# Patient Record
Sex: Female | Born: 1985 | Race: Black or African American | Hispanic: No | Marital: Married | Smoking: Never smoker
Health system: Southern US, Community
[De-identification: ages and names within clinical notes are randomized; demographics above are authoritative.]

## PROBLEM LIST (undated history)

## (undated) DIAGNOSIS — J45909 Unspecified asthma, uncomplicated: Secondary | ICD-10-CM

---

## 2014-03-01 DIAGNOSIS — O4290 Premature rupture of membranes, unspecified as to length of time between rupture and onset of labor, unspecified weeks of gestation: Secondary | ICD-10-CM

## 2014-03-01 DIAGNOSIS — O321XX0 Maternal care for breech presentation, not applicable or unspecified: Secondary | ICD-10-CM

## 2019-05-16 DIAGNOSIS — B349 Viral infection, unspecified: Secondary | ICD-10-CM | POA: Diagnosis not present

## 2019-05-16 DIAGNOSIS — J4541 Moderate persistent asthma with (acute) exacerbation: Secondary | ICD-10-CM | POA: Diagnosis not present

## 2019-07-05 DIAGNOSIS — Z1151 Encounter for screening for human papillomavirus (HPV): Secondary | ICD-10-CM | POA: Diagnosis not present

## 2019-07-05 DIAGNOSIS — Z6831 Body mass index (BMI) 31.0-31.9, adult: Secondary | ICD-10-CM | POA: Diagnosis not present

## 2019-07-05 DIAGNOSIS — Z01419 Encounter for gynecological examination (general) (routine) without abnormal findings: Secondary | ICD-10-CM | POA: Diagnosis not present

## 2019-07-14 DIAGNOSIS — J189 Pneumonia, unspecified organism: Secondary | ICD-10-CM | POA: Diagnosis not present

## 2019-07-14 DIAGNOSIS — J4521 Mild intermittent asthma with (acute) exacerbation: Secondary | ICD-10-CM | POA: Diagnosis not present

## 2019-08-01 DIAGNOSIS — E782 Mixed hyperlipidemia: Secondary | ICD-10-CM | POA: Diagnosis not present

## 2019-08-01 DIAGNOSIS — E559 Vitamin D deficiency, unspecified: Secondary | ICD-10-CM | POA: Diagnosis not present

## 2019-08-01 DIAGNOSIS — Z0000 Encounter for general adult medical examination without abnormal findings: Secondary | ICD-10-CM | POA: Diagnosis not present

## 2019-08-01 DIAGNOSIS — J45909 Unspecified asthma, uncomplicated: Secondary | ICD-10-CM | POA: Diagnosis not present

## 2019-09-27 DIAGNOSIS — E876 Hypokalemia: Secondary | ICD-10-CM | POA: Diagnosis not present

## 2019-09-27 DIAGNOSIS — Z8709 Personal history of other diseases of the respiratory system: Secondary | ICD-10-CM | POA: Diagnosis not present

## 2019-09-27 DIAGNOSIS — J4521 Mild intermittent asthma with (acute) exacerbation: Secondary | ICD-10-CM | POA: Diagnosis not present

## 2019-09-27 DIAGNOSIS — R0602 Shortness of breath: Secondary | ICD-10-CM | POA: Diagnosis not present

## 2019-09-28 DIAGNOSIS — Z8709 Personal history of other diseases of the respiratory system: Secondary | ICD-10-CM | POA: Diagnosis not present

## 2019-09-28 DIAGNOSIS — J45901 Unspecified asthma with (acute) exacerbation: Secondary | ICD-10-CM | POA: Diagnosis not present

## 2019-09-28 DIAGNOSIS — R0602 Shortness of breath: Secondary | ICD-10-CM | POA: Diagnosis not present

## 2019-09-28 DIAGNOSIS — J4521 Mild intermittent asthma with (acute) exacerbation: Secondary | ICD-10-CM | POA: Diagnosis not present

## 2019-09-28 DIAGNOSIS — E876 Hypokalemia: Secondary | ICD-10-CM | POA: Diagnosis not present

## 2019-09-28 DIAGNOSIS — R079 Chest pain, unspecified: Secondary | ICD-10-CM | POA: Diagnosis not present

## 2019-09-28 DIAGNOSIS — R9431 Abnormal electrocardiogram [ECG] [EKG]: Secondary | ICD-10-CM | POA: Diagnosis not present

## 2019-09-28 DIAGNOSIS — R0603 Acute respiratory distress: Secondary | ICD-10-CM | POA: Diagnosis not present

## 2019-09-28 DIAGNOSIS — R7301 Impaired fasting glucose: Secondary | ICD-10-CM | POA: Diagnosis not present

## 2019-09-29 DIAGNOSIS — J45901 Unspecified asthma with (acute) exacerbation: Secondary | ICD-10-CM | POA: Diagnosis not present

## 2019-09-29 DIAGNOSIS — R079 Chest pain, unspecified: Secondary | ICD-10-CM | POA: Diagnosis not present

## 2019-09-29 DIAGNOSIS — J9811 Atelectasis: Secondary | ICD-10-CM | POA: Diagnosis not present

## 2019-09-29 DIAGNOSIS — E876 Hypokalemia: Secondary | ICD-10-CM | POA: Diagnosis not present

## 2019-09-29 DIAGNOSIS — R0603 Acute respiratory distress: Secondary | ICD-10-CM | POA: Diagnosis not present

## 2019-09-30 DIAGNOSIS — E876 Hypokalemia: Secondary | ICD-10-CM | POA: Diagnosis not present

## 2019-09-30 DIAGNOSIS — J45901 Unspecified asthma with (acute) exacerbation: Secondary | ICD-10-CM | POA: Diagnosis not present

## 2019-09-30 DIAGNOSIS — R7301 Impaired fasting glucose: Secondary | ICD-10-CM | POA: Diagnosis not present

## 2019-10-04 DIAGNOSIS — R7309 Other abnormal glucose: Secondary | ICD-10-CM | POA: Diagnosis not present

## 2019-10-04 DIAGNOSIS — R0602 Shortness of breath: Secondary | ICD-10-CM | POA: Diagnosis not present

## 2019-10-10 DIAGNOSIS — R062 Wheezing: Secondary | ICD-10-CM | POA: Diagnosis not present

## 2019-10-10 DIAGNOSIS — Z6831 Body mass index (BMI) 31.0-31.9, adult: Secondary | ICD-10-CM | POA: Diagnosis not present

## 2019-10-10 DIAGNOSIS — J4530 Mild persistent asthma, uncomplicated: Secondary | ICD-10-CM | POA: Diagnosis not present

## 2019-10-10 DIAGNOSIS — J45909 Unspecified asthma, uncomplicated: Secondary | ICD-10-CM | POA: Diagnosis not present

## 2019-12-25 ENCOUNTER — Ambulatory Visit
Admission: EM | Admit: 2019-12-25 | Discharge: 2019-12-25 | Disposition: A | Discharge status: Home / Self Care | Payer: BC Managed Care – PPO

## 2019-12-25 ENCOUNTER — Ambulatory Visit (INDEPENDENT_AMBULATORY_CARE_PROVIDER_SITE_OTHER): Payer: BC Managed Care – PPO

## 2019-12-25 DIAGNOSIS — R0989 Other specified symptoms and signs involving the circulatory and respiratory systems: Secondary | ICD-10-CM

## 2019-12-25 DIAGNOSIS — R509 Fever, unspecified: Secondary | ICD-10-CM | POA: Diagnosis not present

## 2019-12-25 DIAGNOSIS — R05 Cough: Secondary | ICD-10-CM

## 2019-12-25 DIAGNOSIS — R059 Cough, unspecified: Secondary | ICD-10-CM

## 2019-12-25 DIAGNOSIS — Z1152 Encounter for screening for COVID-19: Secondary | ICD-10-CM

## 2019-12-25 HISTORY — DX: Unspecified asthma, uncomplicated: J45.909

## 2019-12-25 MED ORDER — BENZONATATE 200 MG PO CAPS: 200.0000 mg | ORAL_CAPSULE | Freq: Three times a day (TID) | ORAL | 0 refills | Status: DC

## 2019-12-25 MED ORDER — ACETAMINOPHEN 325 MG PO TABS
650.0000 mg | ORAL_TABLET | Freq: Once | ORAL | Status: AC
  Administered 2019-12-25: 650 mg via ORAL

## 2019-12-25 MED ORDER — AZELASTINE HCL 0.1 % NA SOLN: 2.0000 | Freq: Two times a day (BID) | NASAL | 0 refills | Status: DC

## 2019-12-25 NOTE — ED Provider Notes (Signed)
[facility]    CSN: [id number] Arrival date & time: 12/25/19  1216      History   Chief Complaint Chief Complaint  Patient presents with  . Nasal Congestion  . Cough    HPI Lauren Garner is a 34 y.o. female.   34 year old female comes in for 2 day history of productive cough, facial pressure, nasal congestion, right ear pain. States prior to this, has baseline cough due to asthma, and had 1 month of nasal congestion. Denies fever, chills, body aches. Denies abdominal pain, nausea, vomiting, diarrhea. Denies shortness of breath, loss of taste/smell. Denies urinary changes, tick bites. Has not needed to use albuterol.      Past Medical History:  Diagnosis Date  . Asthma     There are no problems to display for this patient.   Past Surgical History:  Procedure Laterality Date  . CESAREAN SECTION      OB History   No obstetric history on file.      Home Medications    Prior to Admission medications   Medication Sig Start Date End Date Taking? Authorizing Provider  albuterol (ACCUNEB) 0.63 MG/3ML nebulizer solution Take 1 ampule by nebulization every 6 (six) hours as needed for wheezing.   Yes [provider]  albuterol (VENTOLIN HFA) 108 (90 Base) MCG/ACT inhaler Inhale into the lungs every 6 (six) hours as needed for wheezing or shortness of breath.   Yes [provider]  budesonide-formoterol (SYMBICORT) 160-4.5 MCG/ACT inhaler Inhale 2 puffs into the lungs 2 (two) times daily.   Yes [provider]  loratadine (CLARITIN) 10 MG tablet Take 10 mg by mouth daily.   Yes [provider]  montelukast (SINGULAIR) 10 MG tablet Take 10 mg by mouth at bedtime.   Yes [provider]  azelastine (ASTELIN) 0.1 % nasal spray Place 2 sprays into both nostrils 2 (two) times daily. 12/25/19   Lauren Garner, Lauren Garner V, PA-C  benzonatate (TESSALON) 200 MG capsule Take 1 capsule (200 mg total) by mouth every 8 (eight) hours. 12/25/19    Ok Edwards, PA-C    Family History Family History  Problem Relation Age of Onset  . Cancer Mother   . Hypertension Mother   . Hyperlipidemia Mother   . Cancer Father   . Hyperlipidemia Father     Social History Social History   Tobacco Use  . Smoking status: Never Smoker  . Smokeless tobacco: Never Used  Vaping Use  . Vaping Use: Never used  Substance Use Topics  . Alcohol use: Yes  . Drug use: Never     Allergies   Percocet [oxycodone-acetaminophen] and Pineapple   Review of Systems Review of Systems  Reason unable to perform ROS: See HPI as above.     Physical Exam Triage Vital Signs ED Triage Vitals  Enc Vitals Group     BP 12/25/19 1418 127/79     Pulse Rate 12/25/19 1418 (!) 109     Resp 12/25/19 1418 20     Temp 12/25/19 1418 (!) 101.8 F (38.8 C)     Temp Source 12/25/19 1418 Oral     SpO2 12/25/19 1418 97 %     Weight --      Height --      Head Circumference --      Peak Flow --      Pain Score 12/25/19 1412 8     Pain Loc --      Pain Edu? --  Excl. in GC? --    No data found.  Updated Vital Signs BP 127/79 (BP Location: Right Arm)   Pulse (!) 109   Temp (!) 101.8 F (38.8 C) (Oral)   Resp 20   SpO2 97%   Physical Exam Constitutional:      General: She is not in acute distress.    Appearance: Normal appearance. She is well-developed. She is not ill-appearing, toxic-appearing or diaphoretic.  HENT:     Head: Normocephalic and atraumatic.     Right Ear: Tympanic membrane, ear canal and external ear normal. Tympanic membrane is not erythematous or bulging.     Left Ear: Tympanic membrane, ear canal and external ear normal. Tympanic membrane is not erythematous or bulging.     Nose: Congestion and rhinorrhea present.     Left Turbinates: Swollen.     Right Sinus: No maxillary sinus tenderness or frontal sinus tenderness.     Left Sinus: No maxillary sinus tenderness or frontal sinus tenderness.     Mouth/Throat:     Mouth:  Mucous membranes are moist.     Pharynx: Oropharynx is clear. Uvula midline.  Eyes:     Conjunctiva/sclera: Conjunctivae normal.     Pupils: Pupils are equal, round, and reactive to light.  Cardiovascular:     Rate and Rhythm: Normal rate and regular rhythm.  Pulmonary:     Effort: Pulmonary effort is normal. No accessory muscle usage, prolonged expiration, respiratory distress or retractions.     Breath sounds: No decreased air movement or transmitted upper airway sounds. No decreased breath sounds.     Comments: LCTAB Musculoskeletal:     Cervical back: Normal range of motion and neck supple.  Skin:    General: Skin is warm and dry.  Neurological:     Mental Status: She is alert and oriented to person, place, and time.      UC Treatments / Results  Labs (all labs ordered are listed, but only abnormal results are displayed) Labs Reviewed  NOVEL CORONAVIRUS, NAA    EKG   Radiology DG Chest 2 View  Result Date: 12/25/2019 CLINICAL DATA:  Cough and congestion EXAM: CHEST - 2 VIEW COMPARISON:  None. FINDINGS: Lungs are clear. The heart size and pulmonary vascularity are normal. No adenopathy. No pneumothorax. There is upper thoracic levoscoliosis. IMPRESSION: Lungs clear.  Cardiac silhouette normal. Electronically Signed   By: Lowella Grip III M.D.   On: 12/25/2019 14:57    Procedures Procedures (including critical care time)  Medications Ordered in UC Medications  acetaminophen (TYLENOL) tablet 650 mg (650 mg Oral Given 12/25/19 1426)    Initial Impression / Assessment and Plan / UC Course  I have reviewed the triage vital signs and the nursing notes.  Pertinent labs & imaging results that were available during my care of the patient were reviewed by me and considered in my medical decision making (see chart for details).    Patient febrile at 101.8 at arrival, nontoxic in appearance. Lungs clear to auscultation bilaterally without adventitious lung sounds. Chest  x-ray negative for active cardiopulmonary disease. Will obtain Covid testing, symptomatic treatment discussed. Return precautions given. Patient expresses understanding and agrees plan.  Final Clinical Impressions(s) / UC Diagnoses   Final diagnoses:  Fever, unspecified  Cough  Encounter for screening for COVID-19    ED Prescriptions    Medication Sig Dispense Auth. Provider   azelastine (ASTELIN) 0.1 % nasal spray Place 2 sprays into both nostrils 2 (two) times daily.  30 mL Decie Verne V, PA-C   benzonatate (TESSALON) 200 MG capsule Take 1 capsule (200 mg total) by mouth every 8 (eight) hours. 21 capsule Ok Edwards, PA-C     PDMP not reviewed this encounter.   Ok Edwards, PA-C 12/25/19 1513

## 2019-12-25 NOTE — Discharge Instructions (Addendum)
Chest xray negative. COVID PCR testing ordered. I would like you to quarantine until testing results. Tessalon for cough. Continue flonase and add azelastine for nasal congestion. You can use over the counter afrin at night for the next 1-3 days if needed. Do not use more than 3 days as it can cause rebound congestion. You can use over the counter nasal saline rinse such as neti pot for nasal congestion. Keep hydrated, your urine should be clear to pale yellow in color. Tylenol/motrin for fever and pain.  Keep hydrated, urine should be clear to pale yellow in color. If experiencing shortness of breath, trouble breathing, go to the emergency department for further evaluation needed.

## 2019-12-25 NOTE — ED Triage Notes (Signed)
Pt c/o congestion for approx 1 months, productive cough, HA, facial pressure, right ear pain since Monday. Denies fever, n/v/d, sore throat, body aches, SOB. Taking tylenol cold/sinus with mild improvement of symptoms.

## 2019-12-27 LAB — SARS-COV-2, NAA 2 DAY TAT

## 2019-12-27 LAB — NOVEL CORONAVIRUS, NAA: SARS-CoV-2, NAA: DETECTED — AB

## 2020-01-02 ENCOUNTER — Telehealth (HOSPITAL_COMMUNITY): Payer: Self-pay | Admitting: Emergency Medicine

## 2020-01-02 NOTE — Telephone Encounter (Signed)
Patient called and states she has not seen a change in her symptoms in her 10 days of quarantine with COVID.  Patient coughing on the phone, and losing breath while trying to talk.  This Rn encouraged patient to return to our facilities to be assessed.  Patient verbalized understanding.

## 2020-04-13 DIAGNOSIS — J0100 Acute maxillary sinusitis, unspecified: Secondary | ICD-10-CM | POA: Diagnosis not present

## 2020-04-13 DIAGNOSIS — B373 Candidiasis of vulva and vagina: Secondary | ICD-10-CM | POA: Diagnosis not present

## 2020-04-13 DIAGNOSIS — R0981 Nasal congestion: Secondary | ICD-10-CM | POA: Diagnosis not present

## 2020-04-13 DIAGNOSIS — Z20822 Contact with and (suspected) exposure to covid-19: Secondary | ICD-10-CM | POA: Diagnosis not present

## 2020-04-13 DIAGNOSIS — J3489 Other specified disorders of nose and nasal sinuses: Secondary | ICD-10-CM | POA: Diagnosis not present

## 2020-04-22 DIAGNOSIS — Z3046 Encounter for surveillance of implantable subdermal contraceptive: Secondary | ICD-10-CM | POA: Diagnosis not present

## 2020-06-01 ENCOUNTER — Other Ambulatory Visit: Payer: Self-pay

## 2020-06-01 ENCOUNTER — Encounter (HOSPITAL_COMMUNITY): Payer: Self-pay

## 2020-06-01 ENCOUNTER — Emergency Department (HOSPITAL_COMMUNITY): Payer: BC Managed Care – PPO

## 2020-06-01 ENCOUNTER — Emergency Department (HOSPITAL_COMMUNITY)
Admission: EM | Admit: 2020-06-01 | Discharge: 2020-06-01 | Disposition: A | Discharge status: Home / Self Care | Payer: BC Managed Care – PPO | Attending: Emergency Medicine | Admitting: Emergency Medicine

## 2020-06-01 DIAGNOSIS — J4520 Mild intermittent asthma, uncomplicated: Secondary | ICD-10-CM | POA: Insufficient documentation

## 2020-06-01 DIAGNOSIS — R0789 Other chest pain: Secondary | ICD-10-CM

## 2020-06-01 DIAGNOSIS — R079 Chest pain, unspecified: Secondary | ICD-10-CM | POA: Diagnosis not present

## 2020-06-01 DIAGNOSIS — Z20822 Contact with and (suspected) exposure to covid-19: Secondary | ICD-10-CM | POA: Diagnosis not present

## 2020-06-01 DIAGNOSIS — R059 Cough, unspecified: Secondary | ICD-10-CM | POA: Diagnosis not present

## 2020-06-01 DIAGNOSIS — Z7951 Long term (current) use of inhaled steroids: Secondary | ICD-10-CM | POA: Insufficient documentation

## 2020-06-01 DIAGNOSIS — R0600 Dyspnea, unspecified: Secondary | ICD-10-CM | POA: Diagnosis not present

## 2020-06-01 DIAGNOSIS — J45909 Unspecified asthma, uncomplicated: Secondary | ICD-10-CM | POA: Diagnosis not present

## 2020-06-01 LAB — CBC
HCT: 43.4 % (ref 36.0–46.0)
Hemoglobin: 14.5 g/dL (ref 12.0–15.0)
MCH: 29.6 pg (ref 26.0–34.0)
MCHC: 33.4 g/dL (ref 30.0–36.0)
MCV: 88.6 fL (ref 80.0–100.0)
Platelets: 292 10*3/uL (ref 150–400)
RBC: 4.9 MIL/uL (ref 3.87–5.11)
RDW: 12.6 % (ref 11.5–15.5)
WBC: 9.6 10*3/uL (ref 4.0–10.5)
nRBC: 0 % (ref 0.0–0.2)

## 2020-06-01 LAB — TROPONIN I (HIGH SENSITIVITY)
Troponin I (High Sensitivity): 5 ng/L (ref <18)
Troponin I (High Sensitivity): <2 ng/L (ref <18)

## 2020-06-01 LAB — BASIC METABOLIC PANEL
Anion gap: 13 (ref 5–15)
BUN: 13 mg/dL (ref 6–20)
CO2: 20 mmol/L — ABNORMAL LOW (ref 22–32)
Calcium: 9.6 mg/dL (ref 8.9–10.3)
Chloride: 103 mmol/L (ref 98–111)
Creatinine, Ser: 0.93 mg/dL (ref 0.44–1.00)
GFR, Estimated: >60 mL/min (ref >60)
Glucose, Bld: 98 mg/dL (ref 70–99)
Potassium: 3.1 mmol/L — ABNORMAL LOW (ref 3.5–5.1)
Sodium: 136 mmol/L (ref 135–145)

## 2020-06-01 LAB — I-STAT BETA HCG BLOOD, ED (MC, WL, AP ONLY): I-stat hCG, quantitative: <5 m[IU]/mL (ref <5)

## 2020-06-01 LAB — SARS CORONAVIRUS 2 (TAT 6-24 HRS): SARS Coronavirus 2: NEGATIVE

## 2020-06-01 MED ORDER — DEXAMETHASONE SODIUM PHOSPHATE 10 MG/ML IJ SOLN
10.0000 mg | Freq: Once | INTRAMUSCULAR | Status: AC
  Administered 2020-06-01: 10 mg via INTRAVENOUS
  Filled 2020-06-01: qty 1

## 2020-06-01 MED ORDER — POTASSIUM CHLORIDE CRYS ER 20 MEQ PO TBCR
40.0000 meq | EXTENDED_RELEASE_TABLET | Freq: Once | ORAL | Status: AC
  Administered 2020-06-01: 40 meq via ORAL
  Filled 2020-06-01: qty 2

## 2020-06-01 MED ORDER — ALBUTEROL SULFATE HFA 108 (90 BASE) MCG/ACT IN AERS
8.0000 | INHALATION_SPRAY | Freq: Once | RESPIRATORY_TRACT | Status: AC
  Administered 2020-06-01: 8 via RESPIRATORY_TRACT
  Filled 2020-06-01: qty 6.7

## 2020-06-01 MED ORDER — AEROCHAMBER PLUS FLO-VU MEDIUM MISC
1.0000 | Freq: Once | Status: AC
  Administered 2020-06-01: 1

## 2020-06-01 NOTE — ED Notes (Signed)
Patient back from XRAY

## 2020-06-01 NOTE — ED Notes (Signed)
Patient transported to X-ray 

## 2020-06-01 NOTE — ED Provider Notes (Signed)
[facility] DEPT Provider Note   CSN: [id number] Arrival date & time: 06/01/20  [id number]     History Chief Complaint  Patient presents with  . Asthma  . Chest Pain    Lauren Garner is a 35 y.o. female.  35yo F w/ PMH including asthma who p/w SOB and chest pain. PT states that about 2 days ago, she began using her rescue inhaler more and has had slight cough and hoarseness. She has had 2 days of constant, squeezing central chest pain. She has runny nose chronically. She reports using long-acting inhaler "but not like I'm supposed to." No fevers, vomiting, diarrhea, sore throat, sick contacts, or history of blood clots. Last albuterol was just PTA.  The history is provided by the patient.  Asthma Associated symptoms include chest pain.  Chest Pain      Past Medical History:  Diagnosis Date  . Asthma     There are no problems to display for this patient.   Past Surgical History:  Procedure Laterality Date  . CESAREAN SECTION       OB History   No obstetric history on file.     Family History  Problem Relation Age of Onset  . Cancer Mother   . Hypertension Mother   . Hyperlipidemia Mother   . Cancer Father   . Hyperlipidemia Father     Social History   Tobacco Use  . Smoking status: Never Smoker  . Smokeless tobacco: Never Used  Vaping Use  . Vaping Use: Never used  Substance Use Topics  . Alcohol use: Yes  . Drug use: Never    Home Medications Prior to Admission medications   Medication Sig Start Date End Date Taking? Authorizing Provider  albuterol (ACCUNEB) 0.63 MG/3ML nebulizer solution Take 1 ampule by nebulization every 6 (six) hours as needed for wheezing.    [provider]  albuterol (VENTOLIN HFA) 108 (90 Base) MCG/ACT inhaler Inhale into the lungs every 6 (six) hours as needed for wheezing or shortness of breath.    [provider]  azelastine (ASTELIN) 0.1 % nasal spray Place 2 sprays  into both nostrils 2 (two) times daily. 12/25/19   Tasia Catchings, Amy V, PA-C  benzonatate (TESSALON) 200 MG capsule Take 1 capsule (200 mg total) by mouth every 8 (eight) hours. 12/25/19   Tasia Catchings, Amy V, PA-C  budesonide-formoterol (SYMBICORT) 160-4.5 MCG/ACT inhaler Inhale 2 puffs into the lungs 2 (two) times daily.    [provider]  loratadine (CLARITIN) 10 MG tablet Take 10 mg by mouth daily.    [provider]  montelukast (SINGULAIR) 10 MG tablet Take 10 mg by mouth at bedtime.    [provider]    Allergies    Percocet [oxycodone-acetaminophen] and Pineapple  Review of Systems   Review of Systems  Cardiovascular: Positive for chest pain.   All other systems reviewed and are negative except that which was mentioned in HPI  Physical Exam Updated Vital Signs BP 101/88   Pulse 83   Temp 97.7 F (36.5 C) (Oral)   Resp 14   Ht 5\' 1"  (1.549 m)   Wt 74.8 kg   SpO2 100%   BMI 31.18 kg/m   Physical Exam Vitals and nursing note reviewed.  Constitutional:      General: She is not in acute distress.    Appearance: She is well-developed and well-nourished.     Comments: anxious  HENT:     Head: Normocephalic and atraumatic.  Comments: Moist mucous membranesEyes:     Conjunctiva/sclera: Conjunctivae normal.  Cardiovascular:     Rate and Rhythm: Normal rate and regular rhythm.     Heart sounds: Normal heart sounds. No murmur heard.   Pulmonary:     Breath sounds: Normal breath sounds.     Comments: Very mildly increased WOB but clear breath sounds, no wheezing; slightly hoarse voice, no stridor Abdominal:     General: Bowel sounds are normal. There is no distension.     Palpations: Abdomen is soft.     Tenderness: There is no abdominal tenderness.  Musculoskeletal:        General: No edema.     Cervical back: Neck supple.  Skin:    General: Skin is warm and dry.  Neurological:     Mental Status: She is alert and oriented to person, place, and time.      Comments: Fluent speech  Psychiatric:        Mood and Affect: Mood is anxious.        Judgment: Judgment normal.     ED Results / Procedures / Treatments   Labs (all labs ordered are listed, but only abnormal results are displayed) Labs Reviewed  BASIC METABOLIC PANEL - Abnormal; Notable for the following components:      Result Value   Potassium 3.1 (*)    CO2 20 (*)    All other components within normal limits  SARS CORONAVIRUS 2 (TAT 6-24 HRS)  CBC  I-STAT BETA HCG BLOOD, ED ([facility], [facility], [facility] ONLY)  TROPONIN I (HIGH SENSITIVITY)  TROPONIN I (HIGH SENSITIVITY)    EKG EKG Interpretation  Date/Time:  Monday June 01 2020 08:14:18 EST Ventricular Rate:  73 PR Interval:    QRS Duration: 80 QT Interval:  407 QTC Calculation: 449 R Axis:   70 Text Interpretation: Sinus rhythm Borderline repolarization abnormality 12 Lead; [place] No previous ECGs available no acute ischemic changes Confirmed by Theotis Burrow [phone]) on 06/01/2020 8:25:02 AM   Radiology DG Chest 2 View  Result Date: 06/01/2020 CLINICAL DATA:  Cough, dyspnea, chest pain EXAM: CHEST - 2 VIEW COMPARISON:  12/25/2019 chest radiograph. FINDINGS: Stable cardiomediastinal silhouette with normal heart size. No pneumothorax. No pleural effusion. Lungs appear clear, with no acute consolidative airspace disease and no pulmonary edema. IMPRESSION: No active cardiopulmonary disease. Electronically Signed   By: Ilona Sorrel M.D.   On: 06/01/2020 09:12    Procedures Procedures   Medications Ordered in ED Medications  potassium chloride SA (KLOR-CON) CR tablet 40 mEq (has no administration in time range)  albuterol (VENTOLIN HFA) 108 (90 Base) MCG/ACT inhaler 8 puff (8 puffs Inhalation Given 06/01/20 0844)  dexamethasone (DECADRON) injection 10 mg (10 mg Intravenous Given 06/01/20 0841)  AeroChamber Plus Flo-Vu Medium MISC 1 each (1 each Other Given 06/01/20 4403)    ED Course  I have reviewed the triage vital signs  and the nursing notes.  Pertinent labs & imaging results that were available during my care of the patient were reviewed by me and considered in my medical decision making (see chart for details).    MDM Rules/Calculators/A&P                          PT anxious on exam but VS and exam reassuring. Initially gave decadron and albuterol while awaiting work up. EKG without acute ischemic changes. Trop negative. GIven 2 days of constant, atypical CP with reassuring EKG and trop,  I feel sx very unlikely to represent ACS, PE, or other life-threatening process. CXR clear. PT calm and felt better after albuterol, breath sounds remain clear. I suspect viral URI, recommended COVID testing and discussed how to obtain results. Discussed supportive measures. I do suspect she also has a component of anxiety. Encouraged to f/u with a PCP. Reviewed return precautions.  Lauren Garner was evaluated in Emergency Department on 06/01/2020 for the symptoms described in the history of present illness. She was evaluated in the context of the global COVID-19 pandemic, which necessitated consideration that the patient might be at risk for infection with the SARS-CoV-2 virus that causes COVID-19. Institutional protocols and algorithms that pertain to the evaluation of patients at risk for COVID-19 are in a state of rapid change based on information released by regulatory bodies including the CDC and federal and state organizations. These policies and algorithms were followed during the patient's care in the ED.  Final Clinical Impression(s) / ED Diagnoses Final diagnoses:  Mild intermittent asthma, unspecified whether complicated  Atypical chest pain    Rx / DC Orders ED Discharge Orders    None       Dian Laprade, Wenda Overland, MD 06/01/20 1001

## 2020-06-01 NOTE — ED Triage Notes (Signed)
Pt presents with c/o asthma attack and chest pain. Pt reports she has been diagnosed with asthma approx one year ago. Pt used both her long-acting and short-acting inhaler this morning with no relief. Pt is visibly having a hard time breathing in triage, also reports constant chest pain across her entire chest.

## 2020-07-02 DIAGNOSIS — J4540 Moderate persistent asthma, uncomplicated: Secondary | ICD-10-CM | POA: Diagnosis not present

## 2020-07-02 DIAGNOSIS — D72829 Elevated white blood cell count, unspecified: Secondary | ICD-10-CM | POA: Diagnosis not present

## 2020-07-02 DIAGNOSIS — Z1159 Encounter for screening for other viral diseases: Secondary | ICD-10-CM | POA: Diagnosis not present

## 2020-09-10 DIAGNOSIS — R059 Cough, unspecified: Secondary | ICD-10-CM | POA: Diagnosis not present

## 2020-09-10 DIAGNOSIS — J069 Acute upper respiratory infection, unspecified: Secondary | ICD-10-CM | POA: Diagnosis not present

## 2020-09-10 DIAGNOSIS — J029 Acute pharyngitis, unspecified: Secondary | ICD-10-CM | POA: Diagnosis not present

## 2020-09-10 DIAGNOSIS — U071 COVID-19: Secondary | ICD-10-CM | POA: Diagnosis not present

## 2020-10-29 DIAGNOSIS — Z0000 Encounter for general adult medical examination without abnormal findings: Secondary | ICD-10-CM | POA: Diagnosis not present

## 2020-10-29 DIAGNOSIS — D649 Anemia, unspecified: Secondary | ICD-10-CM | POA: Diagnosis not present

## 2020-12-17 DIAGNOSIS — Z3169 Encounter for other general counseling and advice on procreation: Secondary | ICD-10-CM | POA: Diagnosis not present

## 2020-12-17 DIAGNOSIS — N979 Female infertility, unspecified: Secondary | ICD-10-CM | POA: Diagnosis not present

## 2020-12-17 DIAGNOSIS — E669 Obesity, unspecified: Secondary | ICD-10-CM | POA: Diagnosis not present

## 2020-12-25 DIAGNOSIS — N979 Female infertility, unspecified: Secondary | ICD-10-CM | POA: Diagnosis not present

## 2021-01-28 DIAGNOSIS — N979 Female infertility, unspecified: Secondary | ICD-10-CM | POA: Diagnosis not present

## 2021-02-18 DIAGNOSIS — Z3169 Encounter for other general counseling and advice on procreation: Secondary | ICD-10-CM | POA: Diagnosis not present

## 2021-03-02 DIAGNOSIS — Z3201 Encounter for pregnancy test, result positive: Secondary | ICD-10-CM | POA: Diagnosis not present

## 2021-03-16 DIAGNOSIS — O09521 Supervision of elderly multigravida, first trimester: Secondary | ICD-10-CM | POA: Diagnosis not present

## 2021-03-16 DIAGNOSIS — O26899 Other specified pregnancy related conditions, unspecified trimester: Secondary | ICD-10-CM | POA: Diagnosis not present

## 2021-03-16 DIAGNOSIS — Z3201 Encounter for pregnancy test, result positive: Secondary | ICD-10-CM | POA: Diagnosis not present

## 2021-03-16 DIAGNOSIS — Z3480 Encounter for supervision of other normal pregnancy, unspecified trimester: Secondary | ICD-10-CM | POA: Diagnosis not present

## 2021-03-16 DIAGNOSIS — O26851 Spotting complicating pregnancy, first trimester: Secondary | ICD-10-CM | POA: Diagnosis not present

## 2021-03-16 LAB — OB RESULTS CONSOLE HEPATITIS B SURFACE ANTIGEN: Hepatitis B Surface Ag: NEGATIVE

## 2021-03-16 LAB — OB RESULTS CONSOLE RUBELLA ANTIBODY, IGM: Rubella: IMMUNE

## 2021-03-16 LAB — OB RESULTS CONSOLE HIV ANTIBODY (ROUTINE TESTING): HIV: NONREACTIVE

## 2021-03-23 ENCOUNTER — Other Ambulatory Visit: Payer: Self-pay | Admitting: Obstetrics and Gynecology

## 2021-03-23 DIAGNOSIS — Z363 Encounter for antenatal screening for malformations: Secondary | ICD-10-CM

## 2021-03-29 DIAGNOSIS — Z3481 Encounter for supervision of other normal pregnancy, first trimester: Secondary | ICD-10-CM | POA: Diagnosis not present

## 2021-03-29 DIAGNOSIS — Z3490 Encounter for supervision of normal pregnancy, unspecified, unspecified trimester: Secondary | ICD-10-CM | POA: Diagnosis not present

## 2021-04-15 ENCOUNTER — Encounter: Payer: Self-pay | Admitting: *Deleted

## 2021-04-20 ENCOUNTER — Other Ambulatory Visit: Payer: Self-pay | Admitting: *Deleted

## 2021-04-20 ENCOUNTER — Ambulatory Visit: Payer: BC Managed Care – PPO | Admitting: *Deleted

## 2021-04-20 ENCOUNTER — Other Ambulatory Visit: Payer: Self-pay

## 2021-04-20 ENCOUNTER — Ambulatory Visit: Payer: BC Managed Care – PPO | Attending: Obstetrics and Gynecology

## 2021-04-20 ENCOUNTER — Ambulatory Visit: Payer: BC Managed Care – PPO | Attending: Obstetrics and Gynecology | Admitting: Obstetrics and Gynecology

## 2021-04-20 VITALS — BP 120/73 | HR 101

## 2021-04-20 DIAGNOSIS — O09891 Supervision of other high risk pregnancies, first trimester: Secondary | ICD-10-CM

## 2021-04-20 DIAGNOSIS — O30041 Twin pregnancy, dichorionic/diamniotic, first trimester: Secondary | ICD-10-CM | POA: Diagnosis not present

## 2021-04-20 DIAGNOSIS — O09521 Supervision of elderly multigravida, first trimester: Secondary | ICD-10-CM

## 2021-04-20 DIAGNOSIS — O99211 Obesity complicating pregnancy, first trimester: Secondary | ICD-10-CM

## 2021-04-20 DIAGNOSIS — Z3A12 12 weeks gestation of pregnancy: Secondary | ICD-10-CM

## 2021-04-20 DIAGNOSIS — Z363 Encounter for antenatal screening for malformations: Secondary | ICD-10-CM | POA: Diagnosis not present

## 2021-04-20 DIAGNOSIS — O09211 Supervision of pregnancy with history of pre-term labor, first trimester: Secondary | ICD-10-CM

## 2021-04-20 DIAGNOSIS — O34219 Maternal care for unspecified type scar from previous cesarean delivery: Secondary | ICD-10-CM

## 2021-04-20 NOTE — Progress Notes (Signed)
Maternal-Fetal Medicine   Name: Lauren Garner DOB: 1985/05/16 MRN: [id number] Referring Provider: Christophe Louis, MD  I had the pleasure of seeing [facility] at [facility]. She is G2 P0101 at 12-weeks' gestation with twin pregnancy and is here for ultrasound evaluation.  This is a natural conception. Obstetrical history significant for a preterm cesarean delivery in 2015 of a female infant weighing 4 pounds and 15 ounces at birth.  She had spontaneous rupture of membranes and the presentation was breech.  Her son is in good health.  Past medical history: Patient has mild intermittent asthma that is well controlled with albuterol inhalers. Past surgical history: Cesarean section. Medications: Prenatal vitamins, albuterol inhaler as. Allergies: Percocet (itching). Social history: Denies tobacco or drug or alcohol use.  She has been married 7 years and her husband is in good health. Family history: No history of venous thromboembolism in the family.  Ultrasound We confirmed dichorionic-diamniotic twin pregnancy.  Thick dividing membrane is seen.  Twin A: Lower fetus, posterior placenta.  The [place] measurement is consistent with her previously established dates.  Amniotic fluid is normal.  Fetal anatomical survey that could be ascertained at this gestational age appears normal.  Twin B: Upper fetus, posterior placenta. The [place] measurement is consistent with her previously established dates.  Amniotic fluid is normal.  Fetal anatomical survey that could be ascertained at this gestational age appears normal.  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 No evidence of placenta previa or placenta accreta spectrum.  I counseled the patient on the following: Dichorionic-diamniotic twin pregnancy I explained the significance of chorionicity and its implications. Possible complications associated with twin pregnancy are preterm  labor/delivery (most common), fetal growth restriction of one or both twins, miscarriage, malpresentations and increased cesarean delivery rate, postpartum hemorrhage. I also informed her that maternal complications including gestational diabetes and gestational hypertension/preeclampsia are more common. I discussed mode of delivery.  Informed her that she can attempt vaginal delivery of twins after a previous cesarean delivery.  Patient wants to have repeat cesarean delivery but will discuss with you in the third trimester. Low-dose aspirin is beneficial in preventing preeclampsia. Twin pregnancy is at high risk for preeclampsia.  I advised her to take aspirin 81 mg from next week till delivery.  She does not have contraindications to aspirin.  History of preterm delivery History of preterm delivery and current twin pregnancy increases the risk of recurrent preterm delivery.  Progesterone prophylactic injections are not recommended) pregnancy as they have not shown any benefit in reducing the likelihood of preterm delivery.  Advanced maternal age I counseled the patient that advanced maternal age is associated with increased risk of aneuploidies.  I discussed cell free fetal DNA screening.  I explained that only invasive testing including chorionic villous sampling or amniocentesis will give a definitive result on the fetal karyotype.  Patient is undecided about aneuploidy screening but will be discussing with you at her prenatal visit.  Recommendations -An appointment was made for her to return in 7 weeks for detailed fetal anatomical survey and transvaginal ultrasound to evaluate the cervix (history of preterm delivery). -Fetal growth assessments every 4 weeks till delivery. -BPP at 64- and 37-weeks gestation. -Delivery at [redacted] weeks gestation. -If patient opts for aneuploidy screening, I recommend panorama ([employer]) screening. Thank you for consultation.  If you have any questions or concerns,  please contact me the [facility].  Consultation including face-to-face (more than 50%)  counseling 30 minutes.

## 2021-04-29 DIAGNOSIS — R7303 Prediabetes: Secondary | ICD-10-CM | POA: Diagnosis not present

## 2021-05-07 ENCOUNTER — Other Ambulatory Visit: Payer: Self-pay

## 2021-06-08 ENCOUNTER — Ambulatory Visit: Payer: BC Managed Care – PPO | Attending: Obstetrics and Gynecology

## 2021-06-08 ENCOUNTER — Ambulatory Visit: Payer: BC Managed Care – PPO

## 2021-06-08 ENCOUNTER — Ambulatory Visit (HOSPITAL_BASED_OUTPATIENT_CLINIC_OR_DEPARTMENT_OTHER): Payer: BC Managed Care – PPO | Admitting: Obstetrics

## 2021-06-08 ENCOUNTER — Other Ambulatory Visit: Payer: Self-pay | Admitting: Obstetrics and Gynecology

## 2021-06-08 ENCOUNTER — Other Ambulatory Visit: Payer: Self-pay

## 2021-06-08 ENCOUNTER — Ambulatory Visit: Payer: BC Managed Care – PPO | Admitting: *Deleted

## 2021-06-08 VITALS — BP 118/75 | HR 92

## 2021-06-08 DIAGNOSIS — O09522 Supervision of elderly multigravida, second trimester: Secondary | ICD-10-CM | POA: Insufficient documentation

## 2021-06-08 DIAGNOSIS — O34219 Maternal care for unspecified type scar from previous cesarean delivery: Secondary | ICD-10-CM | POA: Diagnosis not present

## 2021-06-08 DIAGNOSIS — Z3A19 19 weeks gestation of pregnancy: Secondary | ICD-10-CM

## 2021-06-08 DIAGNOSIS — O30042 Twin pregnancy, dichorionic/diamniotic, second trimester: Secondary | ICD-10-CM | POA: Insufficient documentation

## 2021-06-08 DIAGNOSIS — O99211 Obesity complicating pregnancy, first trimester: Secondary | ICD-10-CM | POA: Diagnosis present

## 2021-06-08 DIAGNOSIS — O09521 Supervision of elderly multigravida, first trimester: Secondary | ICD-10-CM

## 2021-06-08 DIAGNOSIS — O30049 Twin pregnancy, dichorionic/diamniotic, unspecified trimester: Secondary | ICD-10-CM

## 2021-06-08 DIAGNOSIS — O365921 Maternal care for other known or suspected poor fetal growth, second trimester, fetus 1: Secondary | ICD-10-CM

## 2021-06-08 DIAGNOSIS — O09891 Supervision of other high risk pregnancies, first trimester: Secondary | ICD-10-CM | POA: Diagnosis present

## 2021-06-08 DIAGNOSIS — O30041 Twin pregnancy, dichorionic/diamniotic, first trimester: Secondary | ICD-10-CM

## 2021-06-08 DIAGNOSIS — O99212 Obesity complicating pregnancy, second trimester: Secondary | ICD-10-CM | POA: Diagnosis not present

## 2021-06-08 NOTE — Progress Notes (Signed)
MFM Note  Lauren Garner was seen due to a spontaneously conceived twin pregnancy and due to advanced maternal age.  She reports that her first child was delivered at 37 weeks weighing only 4 pounds 15 ounces.    She denies any significant past medical history and denies any problems in her current pregnancy.     The patient had a cell free DNA test earlier in her pregnancy which indicated a low risk for trisomy 39, 18, and 13.  These are predicted to be dizygotic/fraternal twins.  A female and female fetus is predicted.     A thick dividing membrane was noted separating the two fetuses, indicating that these are dichorionic, diamniotic twins.  Twin A: Female fetus.  Overall EFW 6 ounces (less than the 1st percentile for her gestational age).  Twin A is measuring 2 weeks behind her dates.  There was normal amniotic fluid noted.  Doppler studies of the umbilical arteries showed absent end-diastolic flow.  There were no signs of reversed end-diastolic flow noted.  Twin B: Female fetus.  Overall EFW 10 ounces (60th percentile for her gestational age).  There was normal amniotic fluid noted.  Doppler studies of the umbilical artery shows absent end-diastolic flow.  There were no signs of reversed end-diastolic flow noted. There is a 41% discordance noted between the weights of twin A and twin B today.  The views of the fetal anatomy were limited due to the smaller fetal size in twin A and the fetal position in twin B.  There were no obvious fetal anomalies noted in either twin.  The limitations of ultrasound in the detection of all anomalies was discussed today.  The following were discussed during today's consultation:  IUGR of twin A in a dichorionic twin pregnancy  The implications and management of growth restriction of one fetus in a dichorionic twin pregnancy was discussed in detail today.  The patient and her husband were advised that there may be an increased risk of fetal aneuploidy  when growth restriction is noted this early in her pregnancy.    They were offered and declined an amniocentesis today for definitive diagnosis of fetal aneuploidy.  They are comfortable with her negative cell free DNA test.  The difficulties of measuring the umbilical artery Doppler studies this early in pregnancy due to the small size of the umbilical cord was discussed.  As absent end-diastolic flow was noted in both fetuses today, there could be a possibility that twin A is measuring small due to placental dysfunction.  There is a possibility that the umbilical artery Doppler studies may improve once the umbilical cords increase in size.  The increased risk of a fetal demise due to IUGR was discussed.  They were reassured that should a demise occur in one fetus of a dichorionic pregnancy, the other fetus should not be affected.  Due to IUGR of twin A, a follow-up exam was scheduled in 2 weeks to assess the umbilical artery Doppler study and amniotic fluid levels.  We will reassess the fetal growth in 3 weeks.  The couple were advised that as long as both fetuses continue to show continued growth, the amniotic fluid levels continue to be normal, and the umbilical artery Doppler studies do not show reversed end-diastolic flow, we will try to delay delivery until she reaches a more optimal gestational age.  They understand that should the umbilical artery Doppler studies show reversed end-diastolic flow, that based on her gestational age and whether or not  the EFW of the growth restricted fetus is considered a viable weight, that either continued outpatient expectant management versus inpatient management versus delivery will be recommended.  They are aware that currently, viability is considered at 23 weeks or greater or an EFW of 400 g to 500 g or greater.  She should receive a complete course of antenatal corticosteroids should she be at risk for delivery after 22 weeks and 5 days.  As  pregnancies with multiple gestations are at increased risk for developing preeclampsia, she should start taking a daily baby aspirin (81 mg per day) to decrease her risk of developing preeclampsia  as soon as possible.  A follow-up exam was scheduled in 2 weeks for fetal assessment and umbilical artery Doppler studies.  The patient was advised that we will continue to follow her closely throughout her pregnancy.    The patient and her husband stated that all of their questions have been answered today.  A total of 45 minutes was spent counseling and coordinating the care for this patient.  Greater than 50% of the time was spent in direct face-to-face contact.

## 2021-06-09 ENCOUNTER — Other Ambulatory Visit: Payer: Self-pay | Admitting: *Deleted

## 2021-06-09 ENCOUNTER — Other Ambulatory Visit: Payer: Self-pay

## 2021-06-09 DIAGNOSIS — O365921 Maternal care for other known or suspected poor fetal growth, second trimester, fetus 1: Secondary | ICD-10-CM

## 2021-06-09 DIAGNOSIS — O30042 Twin pregnancy, dichorionic/diamniotic, second trimester: Secondary | ICD-10-CM

## 2021-06-22 ENCOUNTER — Ambulatory Visit: Payer: BC Managed Care – PPO | Admitting: *Deleted

## 2021-06-22 ENCOUNTER — Ambulatory Visit: Payer: BC Managed Care – PPO | Attending: Obstetrics

## 2021-06-22 ENCOUNTER — Other Ambulatory Visit: Payer: Self-pay

## 2021-06-22 ENCOUNTER — Other Ambulatory Visit: Payer: Self-pay | Admitting: Obstetrics

## 2021-06-22 VITALS — BP 125/89 | HR 105

## 2021-06-22 DIAGNOSIS — O30042 Twin pregnancy, dichorionic/diamniotic, second trimester: Secondary | ICD-10-CM

## 2021-06-22 DIAGNOSIS — O365921 Maternal care for other known or suspected poor fetal growth, second trimester, fetus 1: Secondary | ICD-10-CM | POA: Diagnosis present

## 2021-06-22 DIAGNOSIS — Z3A21 21 weeks gestation of pregnancy: Secondary | ICD-10-CM | POA: Diagnosis not present

## 2021-06-29 ENCOUNTER — Encounter: Payer: Self-pay | Admitting: *Deleted

## 2021-06-29 ENCOUNTER — Ambulatory Visit: Payer: BC Managed Care – PPO | Admitting: *Deleted

## 2021-06-29 ENCOUNTER — Other Ambulatory Visit: Payer: Self-pay

## 2021-06-29 ENCOUNTER — Other Ambulatory Visit: Payer: Self-pay | Admitting: *Deleted

## 2021-06-29 ENCOUNTER — Ambulatory Visit: Payer: BC Managed Care – PPO | Attending: Obstetrics | Admitting: Obstetrics

## 2021-06-29 ENCOUNTER — Ambulatory Visit: Payer: BC Managed Care – PPO | Attending: Obstetrics

## 2021-06-29 VITALS — BP 133/78 | HR 91

## 2021-06-29 DIAGNOSIS — Z3A22 22 weeks gestation of pregnancy: Secondary | ICD-10-CM

## 2021-06-29 DIAGNOSIS — O30042 Twin pregnancy, dichorionic/diamniotic, second trimester: Secondary | ICD-10-CM

## 2021-06-29 DIAGNOSIS — O365921 Maternal care for other known or suspected poor fetal growth, second trimester, fetus 1: Secondary | ICD-10-CM

## 2021-06-29 DIAGNOSIS — O09522 Supervision of elderly multigravida, second trimester: Secondary | ICD-10-CM

## 2021-06-29 DIAGNOSIS — O30022 Conjoined twin pregnancy, second trimester: Secondary | ICD-10-CM

## 2021-06-30 NOTE — Progress Notes (Signed)
MFM Note  Lauren Garner was seen due to a spontaneously conceived dichorionic, diamniotic twin gestation where IUGR of twin A has been noted.  She denies any problems since her last exam and reports feeling fetal movements of both fetuses throughout the day.  She is taking a daily baby aspirin for preeclampsia prophylaxis.  Twin A: Overall EFW 9 ounces (less than the 1st percentile for her gestational age).  Twin A has grown about 3 ounces over the past 3 weeks.  Twin A continues to measure 2 to 3 weeks behind her dates.  There was normal amniotic fluid noted.  Doppler studies of the umbilical arteries showed continued normal forward flow.  There were no signs of absent or reversed end-diastolic flow noted.  A fetal arrhythmia possibly related to PACs was noted in twin A.  There appeared to be an occasional skipped beat noted in twin A.  We will reassess this finding again during her next ultrasound exam.  Should the fetal arrhythmia continue to be present, we will refer her for a fetal echocardiogram with pediatric cardiology.  Twin B: Overall EFW 1 pound 1 ounces (6 th percentile for her gestational age).  Twin B has grown over half a pound over the past 3 weeks. There was normal amniotic fluid noted.  Doppler studies of the umbilical arteries showed continued normal forward flow.  There were no signs of absent or reversed end-diastolic flow noted.  There is a 46% discordance noted between the weights of twin A and twin B today.  The increased risk of a fetal demise of twin A due to IUGR was discussed.  They were reassured that should a demise occur in one fetus of a dichorionic pregnancy, the other fetus should not be affected.  The increased risk of early onset severe preeclampsia, possibly related to abnormal placental function, in cases of IUGR noted very early in pregnancy was discussed again today.  She was advised to continue taking a daily baby aspirin for preeclampsia  prophylaxis.  Due to IUGR of twin A, a follow-up exam was scheduled in 2 weeks to assess the umbilical artery Doppler study and amniotic fluid levels.  We will reassess the fetal growth in 3 weeks.  The couple were advised that as long as both fetuses continue to show continued growth, the amniotic fluid levels continue to be normal, and the umbilical artery Doppler studies do not show reversed end-diastolic flow, we will try to delay delivery until she reaches a more optimal gestational age.  A follow-up exam was scheduled in 2 weeks for fetal assessment and umbilical artery Doppler studies.  The patient was advised that we will continue to follow her closely throughout her pregnancy.    The patient and her husband stated that all of their questions have been answered today.  A total of 20 minutes was spent counseling and coordinating the care for this patient.  Greater than 50% of the time was spent in direct face-to-face contact.

## 2021-07-12 ENCOUNTER — Other Ambulatory Visit: Payer: Self-pay | Admitting: Obstetrics

## 2021-07-12 ENCOUNTER — Other Ambulatory Visit: Payer: Self-pay | Admitting: *Deleted

## 2021-07-12 DIAGNOSIS — O30042 Twin pregnancy, dichorionic/diamniotic, second trimester: Secondary | ICD-10-CM

## 2021-07-12 DIAGNOSIS — O365921 Maternal care for other known or suspected poor fetal growth, second trimester, fetus 1: Secondary | ICD-10-CM

## 2021-07-13 ENCOUNTER — Other Ambulatory Visit: Payer: Self-pay

## 2021-07-13 ENCOUNTER — Encounter: Payer: Self-pay | Admitting: *Deleted

## 2021-07-13 ENCOUNTER — Ambulatory Visit: Payer: BC Managed Care – PPO | Admitting: *Deleted

## 2021-07-13 ENCOUNTER — Other Ambulatory Visit: Payer: Self-pay | Admitting: Obstetrics

## 2021-07-13 ENCOUNTER — Ambulatory Visit: Payer: BC Managed Care – PPO | Attending: Obstetrics

## 2021-07-13 ENCOUNTER — Other Ambulatory Visit: Payer: Self-pay | Admitting: *Deleted

## 2021-07-13 VITALS — BP 121/76 | HR 92

## 2021-07-13 DIAGNOSIS — O30042 Twin pregnancy, dichorionic/diamniotic, second trimester: Secondary | ICD-10-CM | POA: Diagnosis present

## 2021-07-13 DIAGNOSIS — O365921 Maternal care for other known or suspected poor fetal growth, second trimester, fetus 1: Secondary | ICD-10-CM

## 2021-07-13 DIAGNOSIS — O365911 Maternal care for other known or suspected poor fetal growth, first trimester, fetus 1: Secondary | ICD-10-CM

## 2021-07-13 DIAGNOSIS — Z3A24 24 weeks gestation of pregnancy: Secondary | ICD-10-CM

## 2021-07-13 DIAGNOSIS — O09522 Supervision of elderly multigravida, second trimester: Secondary | ICD-10-CM | POA: Diagnosis not present

## 2021-07-14 ENCOUNTER — Other Ambulatory Visit: Payer: Self-pay | Admitting: *Deleted

## 2021-07-14 DIAGNOSIS — O30022 Conjoined twin pregnancy, second trimester: Secondary | ICD-10-CM

## 2021-07-14 DIAGNOSIS — O365991 Maternal care for other known or suspected poor fetal growth, unspecified trimester, fetus 1: Secondary | ICD-10-CM

## 2021-07-19 ENCOUNTER — Other Ambulatory Visit: Payer: Self-pay

## 2021-07-19 ENCOUNTER — Ambulatory Visit: Payer: BC Managed Care – PPO | Attending: Obstetrics

## 2021-07-19 ENCOUNTER — Encounter: Payer: Self-pay | Admitting: *Deleted

## 2021-07-19 ENCOUNTER — Ambulatory Visit: Payer: BC Managed Care – PPO | Admitting: *Deleted

## 2021-07-19 VITALS — BP 124/82 | HR 87

## 2021-07-19 DIAGNOSIS — O30042 Twin pregnancy, dichorionic/diamniotic, second trimester: Secondary | ICD-10-CM

## 2021-07-19 DIAGNOSIS — Z3A25 25 weeks gestation of pregnancy: Secondary | ICD-10-CM

## 2021-07-19 DIAGNOSIS — O365911 Maternal care for other known or suspected poor fetal growth, first trimester, fetus 1: Secondary | ICD-10-CM

## 2021-07-19 DIAGNOSIS — O09522 Supervision of elderly multigravida, second trimester: Secondary | ICD-10-CM

## 2021-07-19 DIAGNOSIS — O365921 Maternal care for other known or suspected poor fetal growth, second trimester, fetus 1: Secondary | ICD-10-CM

## 2021-07-22 LAB — OB RESULTS CONSOLE RPR: RPR: NONREACTIVE

## 2021-07-26 ENCOUNTER — Other Ambulatory Visit: Payer: Self-pay

## 2021-07-26 ENCOUNTER — Ambulatory Visit: Payer: BC Managed Care – PPO | Attending: Obstetrics and Gynecology

## 2021-07-26 ENCOUNTER — Ambulatory Visit (HOSPITAL_BASED_OUTPATIENT_CLINIC_OR_DEPARTMENT_OTHER): Payer: BC Managed Care – PPO | Admitting: *Deleted

## 2021-07-26 ENCOUNTER — Ambulatory Visit: Payer: BC Managed Care – PPO | Admitting: *Deleted

## 2021-07-26 VITALS — BP 124/73 | HR 103

## 2021-07-26 DIAGNOSIS — O09522 Supervision of elderly multigravida, second trimester: Secondary | ICD-10-CM | POA: Diagnosis not present

## 2021-07-26 DIAGNOSIS — O30042 Twin pregnancy, dichorionic/diamniotic, second trimester: Secondary | ICD-10-CM

## 2021-07-26 DIAGNOSIS — Z3A26 26 weeks gestation of pregnancy: Secondary | ICD-10-CM

## 2021-07-26 DIAGNOSIS — O365921 Maternal care for other known or suspected poor fetal growth, second trimester, fetus 1: Secondary | ICD-10-CM

## 2021-07-26 DIAGNOSIS — O365911 Maternal care for other known or suspected poor fetal growth, first trimester, fetus 1: Secondary | ICD-10-CM | POA: Diagnosis present

## 2021-07-26 NOTE — Procedures (Signed)
Lauren Garner ?06/29/1985 ?[redacted]w[redacted]d? ? ?Fetus B Non-Stress Test Interpretation for 07/26/21 ? ?Indication: IUGR and Di/Di twins ? ?Fetal Heart Rate Fetus B ?Mode: External ?Baseline Rate (B): 145 BPM ?Variability: Moderate ?Accelerations: 10 x 10 ?Decelerations: None ? ?Uterine Activity ?Mode: Palpation, Toco ?Contraction Frequency (min): None ?Resting Tone Palpated: Relaxed ? ?  ? ?[name]?3Oct 20, 1987?261w0d ?Fetus A Non-Stress Test Interpretation for 07/26/21 ? ?Indication: IUGR and Di/Di twins ? ?Fetal Heart Rate A ?Mode: External ?Baseline Rate (A): 145 bpm ?Variability: Moderate ?Accelerations: 10 x 10 ?Decelerations: None ?Multiple birth?: Yes ? ?Uterine Activity ?Mode: Palpation, Toco ?Contraction Frequency (min): None ?Resting Tone Palpated: Relaxed ? ?Interpretation (Fetal Testing) ?Nonstress Test Interpretation: Reactive ?Overall Impression: Reassuring for gestational age ?Comments: Dr. FaAnnamaria Bootseviewed tracing. ? ? ?

## 2021-08-03 ENCOUNTER — Ambulatory Visit (HOSPITAL_BASED_OUTPATIENT_CLINIC_OR_DEPARTMENT_OTHER): Payer: BC Managed Care – PPO | Admitting: *Deleted

## 2021-08-03 ENCOUNTER — Ambulatory Visit: Payer: BC Managed Care – PPO | Admitting: *Deleted

## 2021-08-03 ENCOUNTER — Encounter: Payer: Self-pay | Admitting: *Deleted

## 2021-08-03 ENCOUNTER — Ambulatory Visit: Payer: BC Managed Care – PPO | Attending: Obstetrics and Gynecology

## 2021-08-03 VITALS — BP 130/66 | HR 91

## 2021-08-03 DIAGNOSIS — O365920 Maternal care for other known or suspected poor fetal growth, second trimester, not applicable or unspecified: Secondary | ICD-10-CM | POA: Diagnosis not present

## 2021-08-03 DIAGNOSIS — Z3A27 27 weeks gestation of pregnancy: Secondary | ICD-10-CM

## 2021-08-03 DIAGNOSIS — O30042 Twin pregnancy, dichorionic/diamniotic, second trimester: Secondary | ICD-10-CM

## 2021-08-03 DIAGNOSIS — O365911 Maternal care for other known or suspected poor fetal growth, first trimester, fetus 1: Secondary | ICD-10-CM | POA: Diagnosis not present

## 2021-08-03 DIAGNOSIS — O09522 Supervision of elderly multigravida, second trimester: Secondary | ICD-10-CM | POA: Diagnosis not present

## 2021-08-03 NOTE — Procedures (Signed)
Caley Ciaramitaro ?09/16/1985 ?[redacted]w[redacted]d? ?Fetus A Non-Stress Test Interpretation for 08/03/21 ? ?Indication: IUGR and Di/Di twins ? ?Fetal Heart Rate A ?Mode: External ?Baseline Rate (A): 140 bpm ?Variability: Moderate ?Accelerations: 10 x 10 ?Decelerations: None ?Multiple birth?: Yes ? ?Uterine Activity ?Mode: Palpation, Toco ?Contraction Frequency (min): None ? ?Interpretation (Fetal Testing) ?Nonstress Test Interpretation: Reactive ?Overall Impression: Reassuring for gestational age ?Comments: Dr. FAnnamaria Bootsreviewed tracing. ? ?JAryahi Denzler?[birth year]?249w1d ? ?Fetus B Non-Stress Test Interpretation for 08/03/21 ? ?Indication: IUGR and Di/Di twins ? ?Fetal Heart Rate Fetus B ?Mode: External ?Baseline Rate (B): 140 BPM ?Variability: Moderate ?Accelerations: 10 x 10 ?Decelerations: None ? ?Uterine Activity ?Mode: Palpation, Toco ?Contraction Frequency (min): None ? ?Interpretation (Baby B - Fetal Testing) ?Nonstress Test Interpretation (Baby B): Reactive ?Overall Impression (Baby B): Reassuring for gestational age ?Comments (Baby B): Dr. FaAnnamaria Bootseviewed tracing. ? ? ?

## 2021-08-10 ENCOUNTER — Ambulatory Visit: Payer: BC Managed Care – PPO | Admitting: *Deleted

## 2021-08-10 ENCOUNTER — Ambulatory Visit: Payer: BC Managed Care – PPO | Attending: Obstetrics and Gynecology

## 2021-08-10 ENCOUNTER — Ambulatory Visit (HOSPITAL_BASED_OUTPATIENT_CLINIC_OR_DEPARTMENT_OTHER): Payer: BC Managed Care – PPO | Admitting: *Deleted

## 2021-08-10 ENCOUNTER — Encounter: Payer: Self-pay | Admitting: *Deleted

## 2021-08-10 VITALS — BP 124/72 | HR 104

## 2021-08-10 DIAGNOSIS — O365931 Maternal care for other known or suspected poor fetal growth, third trimester, fetus 1: Secondary | ICD-10-CM

## 2021-08-10 DIAGNOSIS — O365911 Maternal care for other known or suspected poor fetal growth, first trimester, fetus 1: Secondary | ICD-10-CM | POA: Diagnosis present

## 2021-08-10 DIAGNOSIS — O30042 Twin pregnancy, dichorionic/diamniotic, second trimester: Secondary | ICD-10-CM | POA: Diagnosis not present

## 2021-08-10 DIAGNOSIS — O365921 Maternal care for other known or suspected poor fetal growth, second trimester, fetus 1: Secondary | ICD-10-CM | POA: Diagnosis present

## 2021-08-10 DIAGNOSIS — O30043 Twin pregnancy, dichorionic/diamniotic, third trimester: Secondary | ICD-10-CM

## 2021-08-10 DIAGNOSIS — Z3A28 28 weeks gestation of pregnancy: Secondary | ICD-10-CM | POA: Diagnosis not present

## 2021-08-10 DIAGNOSIS — O365932 Maternal care for other known or suspected poor fetal growth, third trimester, fetus 2: Secondary | ICD-10-CM | POA: Diagnosis not present

## 2021-08-10 DIAGNOSIS — O09523 Supervision of elderly multigravida, third trimester: Secondary | ICD-10-CM | POA: Diagnosis not present

## 2021-08-10 NOTE — Procedures (Signed)
Lauren Garner ?06/19/1985 ?[redacted]w[redacted]d? ? ?Fetus B Non-Stress Test Interpretation for 08/10/21 ? ?Indication: Advanced Maternal Age >40 years ? ?Fetal Heart Rate Fetus B ?Mode: External ?Baseline Rate (B): 140 BPM ?Variability: Moderate ?Accelerations: 10 x 10 ?Decelerations: None ? ?Uterine Activity ?Mode: Toco ?Contraction Frequency (min): none ?Resting Tone Palpated: Relaxed ? ?Interpretation (Baby B - Fetal Testing) ?Nonstress Test Interpretation (Baby B): Reactive ?Overall Impression (Baby B): Reassuring for gestational age ?Comments (Baby B): tracing reviewed by Dr. FAnnamaria Boots? ?Lauren Garner?3November 04, 1987?271w1d ?Fetus A Non-Stress Test Interpretation for 08/10/21 ? ?Indication: IUGR ? ?Fetal Heart Rate A ?Mode: External ?Baseline Rate (A): 150 bpm ?Variability: Moderate ?Accelerations: 10 x 10, 15 x 15 ?Decelerations: None ?Multiple birth?: Yes ? ?Uterine Activity ?Mode: Toco ?Contraction Frequency (min): none ?Resting Tone Palpated: Relaxed ? ?Interpretation (Fetal Testing) ?Nonstress Test Interpretation: Reactive ?Overall Impression: Reassuring for gestational age ?Comments: tracing reviewed by Dr. FaAnnamaria Boots ? ?

## 2021-08-11 ENCOUNTER — Other Ambulatory Visit: Payer: Self-pay | Admitting: *Deleted

## 2021-08-11 DIAGNOSIS — O30043 Twin pregnancy, dichorionic/diamniotic, third trimester: Secondary | ICD-10-CM

## 2021-08-17 ENCOUNTER — Ambulatory Visit (HOSPITAL_BASED_OUTPATIENT_CLINIC_OR_DEPARTMENT_OTHER): Payer: BC Managed Care – PPO | Admitting: *Deleted

## 2021-08-17 ENCOUNTER — Ambulatory Visit: Payer: BC Managed Care – PPO | Attending: Obstetrics and Gynecology

## 2021-08-17 ENCOUNTER — Ambulatory Visit: Payer: BC Managed Care – PPO | Admitting: *Deleted

## 2021-08-17 VITALS — BP 128/77 | HR 113

## 2021-08-17 DIAGNOSIS — O365991 Maternal care for other known or suspected poor fetal growth, unspecified trimester, fetus 1: Secondary | ICD-10-CM | POA: Diagnosis present

## 2021-08-17 DIAGNOSIS — Z3A29 29 weeks gestation of pregnancy: Secondary | ICD-10-CM

## 2021-08-17 DIAGNOSIS — O30042 Twin pregnancy, dichorionic/diamniotic, second trimester: Secondary | ICD-10-CM | POA: Diagnosis present

## 2021-08-17 DIAGNOSIS — O365931 Maternal care for other known or suspected poor fetal growth, third trimester, fetus 1: Secondary | ICD-10-CM | POA: Insufficient documentation

## 2021-08-17 DIAGNOSIS — Z362 Encounter for other antenatal screening follow-up: Secondary | ICD-10-CM

## 2021-08-17 DIAGNOSIS — O30022 Conjoined twin pregnancy, second trimester: Secondary | ICD-10-CM | POA: Diagnosis present

## 2021-08-17 DIAGNOSIS — O30009 Twin pregnancy, unspecified number of placenta and unspecified number of amniotic sacs, unspecified trimester: Secondary | ICD-10-CM | POA: Diagnosis present

## 2021-08-17 DIAGNOSIS — O365911 Maternal care for other known or suspected poor fetal growth, first trimester, fetus 1: Secondary | ICD-10-CM | POA: Insufficient documentation

## 2021-08-17 DIAGNOSIS — O365912 Maternal care for other known or suspected poor fetal growth, first trimester, fetus 2: Secondary | ICD-10-CM | POA: Diagnosis not present

## 2021-08-17 DIAGNOSIS — O09523 Supervision of elderly multigravida, third trimester: Secondary | ICD-10-CM

## 2021-08-17 DIAGNOSIS — O30043 Twin pregnancy, dichorionic/diamniotic, third trimester: Secondary | ICD-10-CM

## 2021-08-17 NOTE — Procedures (Signed)
Lauren Garner ?11-04-1985 ?[redacted]w[redacted]d? ?Fetus A Non-Stress Test Interpretation for 08/17/21 ? ?Indication: IUGR ? ?Fetal Heart Rate A ?Mode: External ?Baseline Rate (A): 140 bpm ?Variability: Moderate ?Accelerations: 10 x 10, 15 x 15 ?Decelerations: None ?Multiple birth?: Yes ? ?Uterine Activity ?Mode: Palpation, Toco ?Contraction Frequency (min): None ?Resting Tone Palpated: Relaxed ? ?Interpretation (Fetal Testing) ?Nonstress Test Interpretation: Reactive ?Comments: Dr. FAnnamaria Bootsreviewed tracing. ? ?Lauren Garner?3December 16, 1987?235w1d ? ?Fetus B Non-Stress Test Interpretation for 08/17/21 ? ?Indication:  Di/Di twins ? ?Fetal Heart Rate Fetus B ?Mode: External ?Baseline Rate (B): 135 BPM ?Variability: Moderate ?Accelerations: 15 x 15, 10 x 10 ? ?Uterine Activity ?Mode: Palpation, Toco ?Contraction Frequency (min): None ?Resting Tone Palpated: Relaxed ? ?Interpretation (Baby B - Fetal Testing) ?Nonstress Test Interpretation (Baby B): Reactive ?Comments (Baby B): Dr. FaAnnamaria Bootseviewed tracing. ? ? ?

## 2021-08-20 ENCOUNTER — Inpatient Hospital Stay (HOSPITAL_COMMUNITY)
Admission: AD | Admit: 2021-08-20 | Discharge: 2021-08-20 | Disposition: A | Discharge status: Home / Self Care | Payer: BC Managed Care – PPO | Attending: Obstetrics and Gynecology | Admitting: Obstetrics and Gynecology

## 2021-08-20 ENCOUNTER — Encounter (HOSPITAL_COMMUNITY): Payer: Self-pay | Admitting: Obstetrics and Gynecology

## 2021-08-20 DIAGNOSIS — Z3A29 29 weeks gestation of pregnancy: Secondary | ICD-10-CM | POA: Insufficient documentation

## 2021-08-20 DIAGNOSIS — R030 Elevated blood-pressure reading, without diagnosis of hypertension: Secondary | ICD-10-CM | POA: Insufficient documentation

## 2021-08-20 DIAGNOSIS — O1203 Gestational edema, third trimester: Secondary | ICD-10-CM

## 2021-08-20 DIAGNOSIS — H539 Unspecified visual disturbance: Secondary | ICD-10-CM

## 2021-08-20 DIAGNOSIS — O30043 Twin pregnancy, dichorionic/diamniotic, third trimester: Secondary | ICD-10-CM | POA: Insufficient documentation

## 2021-08-20 DIAGNOSIS — O26893 Other specified pregnancy related conditions, third trimester: Secondary | ICD-10-CM | POA: Diagnosis not present

## 2021-08-20 DIAGNOSIS — R600 Localized edema: Secondary | ICD-10-CM | POA: Diagnosis present

## 2021-08-20 DIAGNOSIS — H538 Other visual disturbances: Secondary | ICD-10-CM | POA: Insufficient documentation

## 2021-08-20 DIAGNOSIS — O09523 Supervision of elderly multigravida, third trimester: Secondary | ICD-10-CM | POA: Diagnosis not present

## 2021-08-20 LAB — CBC
HCT: 33.4 % — ABNORMAL LOW (ref 36.0–46.0)
Hemoglobin: 11.4 g/dL — ABNORMAL LOW (ref 12.0–15.0)
MCH: 31 pg (ref 26.0–34.0)
MCHC: 34.1 g/dL (ref 30.0–36.0)
MCV: 90.8 fL (ref 80.0–100.0)
Platelets: 244 10*3/uL (ref 150–400)
RBC: 3.68 MIL/uL — ABNORMAL LOW (ref 3.87–5.11)
RDW: 13.2 % (ref 11.5–15.5)
WBC: 9.5 10*3/uL (ref 4.0–10.5)
nRBC: 0 % (ref 0.0–0.2)

## 2021-08-20 LAB — URINALYSIS, ROUTINE W REFLEX MICROSCOPIC
Bilirubin Urine: NEGATIVE
Glucose, UA: NEGATIVE mg/dL
Hgb urine dipstick: NEGATIVE
Ketones, ur: 5 mg/dL — AB
Nitrite: NEGATIVE
Protein, ur: NEGATIVE mg/dL
Specific Gravity, Urine: 1.024 (ref 1.005–1.030)
pH: 6 (ref 5.0–8.0)

## 2021-08-20 LAB — COMPREHENSIVE METABOLIC PANEL
ALT: 24 U/L (ref 0–44)
AST: 25 U/L (ref 15–41)
Albumin: 2.7 g/dL — ABNORMAL LOW (ref 3.5–5.0)
Alkaline Phosphatase: 99 U/L (ref 38–126)
Anion gap: 8 (ref 5–15)
BUN: 8 mg/dL (ref 6–20)
CO2: 20 mmol/L — ABNORMAL LOW (ref 22–32)
Calcium: 8.8 mg/dL — ABNORMAL LOW (ref 8.9–10.3)
Chloride: 108 mmol/L (ref 98–111)
Creatinine, Ser: 0.68 mg/dL (ref 0.44–1.00)
GFR, Estimated: >60 mL/min (ref >60)
Glucose, Bld: 81 mg/dL (ref 70–99)
Potassium: 3.9 mmol/L (ref 3.5–5.1)
Sodium: 136 mmol/L (ref 135–145)
Total Bilirubin: 0.4 mg/dL (ref 0.3–1.2)
Total Protein: 6.1 g/dL — ABNORMAL LOW (ref 6.5–8.1)

## 2021-08-20 LAB — PROTEIN / CREATININE RATIO, URINE
Creatinine, Urine: 187.63 mg/dL
Protein Creatinine Ratio: 0.07 mg/mg{Cre} (ref 0.00–0.15)
Total Protein, Urine: 14 mg/dL

## 2021-08-20 MED ORDER — FAMOTIDINE 20 MG PO TABS
20.0000 mg | ORAL_TABLET | Freq: Once | ORAL | Status: AC
  Administered 2021-08-20: 20 mg via ORAL
  Filled 2021-08-20: qty 1

## 2021-08-20 NOTE — Discharge Instructions (Signed)
You have not been diagnosed with pre-eclampsia. Do not use the blood pressure cuff that you have been using. ?

## 2021-08-20 NOTE — MAU Provider Note (Signed)
?History  ?  ? ?CSN: [id number] ? ?Arrival date and time: 08/20/21 1003 ? ? Event Date/Time  ? First Provider Initiated Contact with Patient 08/20/21 1057   ?  ? ?Chief Complaint  ?Patient presents with  ? Hypertension  ? Leg Swelling  ? Blurred Vision  ? ?Ms. Lauren Garner is a 36 y.o. year old [id number] female at 20w4dweeks gestation who presents to MAU reporting really bad swelling in her feet legs. She also reports seeing floaters & blurry vision today after getting out of a hot shower. She took her BP at home using a wrist cuff. The readings she got from that were as follows: 141/108, 137/104, and 131/81. She called [facility] to report those BP readings and her other symptoms. Her pregnancy is complicated by Di-Di twins and AMA. [facility] Her husband is present and contributing to the history taking. ? ? ?OB History   ? ? Gravida  ?2  ? Para  ?1  ? Term  ?   ? Preterm  ?1  ? AB  ?   ? Living  ?1  ?  ? ? SAB  ?   ? IAB  ?   ? Ectopic  ?   ? Multiple  ?   ? Live Births  ?1  ?   ?  ?  ? ? ?Past Medical History:  ?Diagnosis Date  ? Asthma   ? ? ?Past Surgical History:  ?Procedure Laterality Date  ? CESAREAN SECTION    ? ? ?Family History  ?Problem Relation Age of Onset  ? Cancer Mother   ? Hypertension Mother   ? Hyperlipidemia Mother   ? Cancer Father   ? Hyperlipidemia Father   ? ? ?Social History  ? ?Tobacco Use  ? Smoking status: Never  ? Smokeless tobacco: Never  ?Vaping Use  ? Vaping Use: Never used  ?Substance Use Topics  ? Alcohol use: Not Currently  ?  Comment: not since pregnant  ? Drug use: Never  ? ? ?Allergies:  ?Allergies  ?Allergen Reactions  ? Percocet [Oxycodone-Acetaminophen]   ? Vicks Vapo Steam [Camphor-Menthol]   ? Pineapple Rash  ? ? ?Medications Prior to Admission  ?Medication Sig Dispense Refill Last Dose  ? aspirin 81 MG chewable tablet Chew by mouth daily.   08/19/2021  ? calcium carbonate (TUMS EX) 750 MG chewable tablet Chew 1 tablet by mouth daily.    08/19/2021  ? ferrous sulfate 325 (65 FE) MG tablet Take 325 mg by mouth daily with breakfast.   08/19/2021  ? prenatal vitamin w/FE, FA (NATACHEW) 29-1 MG CHEW chewable tablet Chew 1 tablet by mouth daily at 12 noon.   08/19/2021  ? albuterol (VENTOLIN HFA) 108 (90 Base) MCG/ACT inhaler Inhale into the lungs every 6 (six) hours as needed for wheezing or shortness of breath.     ? ? ?Review of Systems  ?Constitutional: Negative.   ?HENT: Negative.    ?Eyes:  Positive for visual disturbance (blurry and seeing floaters today only).  ?Cardiovascular:  Positive for leg swelling.  ?     High BPs at home  ?Gastrointestinal: Negative.   ?Endocrine: Negative.   ?Genitourinary: Negative.   ?Musculoskeletal: Negative.   ?Skin: Negative.   ?Allergic/Immunologic: Negative.   ?Neurological: Negative.   ?Hematological: Negative.   ?Psychiatric/Behavioral: Negative.    ?Physical Exam  ? ?Blood pressure 108/78, pulse 83, temperature 98.2 ?F (36.8 ?C), temperature source Oral, resp. rate 17, height 5'  1" (1.549 m), weight 83.7 kg, last menstrual period 01/25/2021, SpO2 98 %. ?BP range while in MAU: 108-124/74-84 ? ? ?Physical Exam ?Vitals and nursing note reviewed.  ?Constitutional:   ?   Appearance: Normal appearance. She is normal weight.  ?Cardiovascular:  ?   Rate and Rhythm: Normal rate and regular rhythm.  ?   Heart sounds: Normal heart sounds.  ?Pulmonary:  ?   Effort: Pulmonary effort is normal.  ?   Breath sounds: Normal breath sounds.  ?Abdominal:  ?   General: Bowel sounds are normal.  ?   Palpations: Abdomen is soft.  ?Genitourinary: ?   Comments: deferred ?Musculoskeletal:     ?   General: Normal range of motion.  ?Skin: ?   General: Skin is warm and dry.  ?Neurological:  ?   Mental Status: She is alert and oriented to person, place, and time.  ?Psychiatric:     ?   Mood and Affect: Mood normal.     ?   Behavior: Behavior normal.     ?   Thought Content: Thought content normal.     ?   Judgment: Judgment normal.   ? ?REASSURING NST - FHR (A): 145 bpm / moderate variability / accels present / decels absent / FHR (B): 140 bpm / moderate variability / accels present / decels absent TOCO: no UC or UI noted ? ?MAU Course  ?Procedures ? ?MDM ?CCUA ?CBC ?CMP ?P/C Ratio ?Serial BP's  ? ?Results for orders placed or performed during the hospital encounter of 08/20/21 (from the past 72 hour(s))  ?Urinalysis, Routine w reflex microscopic Urine, Clean Catch     Status: Abnormal  ? Collection Time: 08/20/21 10:57 AM  ?Result Value Ref Range  ? Color, Urine YELLOW YELLOW  ? APPearance CLOUDY (A) CLEAR  ? Specific Gravity, Urine 1.024 1.005 - 1.030  ? pH 6.0 5.0 - 8.0  ? Glucose, UA NEGATIVE NEGATIVE mg/dL  ? Hgb urine dipstick NEGATIVE NEGATIVE  ? Bilirubin Urine NEGATIVE NEGATIVE  ? Ketones, ur 5 (A) NEGATIVE mg/dL  ? Protein, ur NEGATIVE NEGATIVE mg/dL  ? Nitrite NEGATIVE NEGATIVE  ? Leukocytes,Ua MODERATE (A) NEGATIVE  ? RBC / HPF 0-5 0 - 5 RBC/hpf  ? WBC, UA 6-10 0 - 5 WBC/hpf  ? Bacteria, UA MANY (A) NONE SEEN  ? Squamous Epithelial / LPF 21-50 0 - 5  ? Mucus PRESENT   ?  Comment: Performed at [facility], [place] [address] [place], [place] [address]  ?Protein / creatinine ratio, urine     Status: None  ? Collection Time: 08/20/21 10:57 AM  ?Result Value Ref Range  ? Creatinine, Urine 187.63 mg/dL  ? Total Protein, Urine 14 mg/dL  ?  Comment: NO NORMAL RANGE ESTABLISHED FOR THIS TEST  ? Protein Creatinine Ratio 0.07 0.00 - 0.15 mg/mg[Cre]  ?  Comment: Performed at [facility], [place] [address], [place], [place] [address]  ?CBC     Status: Abnormal  ? Collection Time: 08/20/21 11:09 AM  ?Result Value Ref Range  ? WBC 9.5 4.0 - 10.5 K/uL  ? RBC 3.68 (L) 3.87 - 5.11 MIL/uL  ? Hemoglobin 11.4 (L) 12.0 - 15.0 g/dL  ? HCT 33.4 (L) 36.0 - 46.0 %  ? MCV 90.8 80.0 - 100.0 fL  ? MCH 31.0 26.0 - 34.0 pg  ? MCHC 34.1 30.0 - 36.0 g/dL  ? RDW 13.2 11.5 - 15.5 %  ? Platelets 244 150 - 400  K/uL  ? nRBC 0.0 0.0 - 0.2 %  ?  Comment:  Performed at [facility], [place] [address] [place], [place] [address]  ?Comprehensive metabolic panel     Status: Abnormal  ? Collection Time: 08/20/21 11:09 AM  ?Result Value Ref Range  ? Sodium 136 135 - 145 mmol/L  ? Potassium 3.9 3.5 - 5.1 mmol/L  ? Chloride 108 98 - 111 mmol/L  ? CO2 20 (L) 22 - 32 mmol/L  ? Glucose, Bld 81 70 - 99 mg/dL  ?  Comment: Glucose reference range applies only to samples taken after fasting for at least 8 hours.  ? BUN 8 6 - 20 mg/dL  ? Creatinine, Ser 0.68 0.44 - 1.00 mg/dL  ? Calcium 8.8 (L) 8.9 - 10.3 mg/dL  ? Total Protein 6.1 (L) 6.5 - 8.1 g/dL  ? Albumin 2.7 (L) 3.5 - 5.0 g/dL  ? AST 25 15 - 41 U/L  ? ALT 24 0 - 44 U/L  ? Alkaline Phosphatase 99 38 - 126 U/L  ? Total Bilirubin 0.4 0.3 - 1.2 mg/dL  ? GFR, Estimated >60 >60 mL/min  ?  Comment: (NOTE) ?Calculated using the CKD-EPI Creatinine Equation (2021) ?  ? Anion gap 8 5 - 15  ?  Comment: Performed at [facility], [place] [address], [place],  [address]  ?Culture, OB Urine     Status: Abnormal  ? Collection Time: 08/20/21 12:21 PM  ? Specimen: OB Clean Catch; Urine  ?Result Value Ref Range  ? Specimen Description OB CLEAN CATCH   ? Special Requests Immunocompromised   ? Culture (A)   ?  MULTIPLE SPECIES PRESENT, SUGGEST RECOLLECTION ?NO GROUP B STREP (S.AGALACTIAE) ISOLATED ?Performed at [facility], [place] [address], [place],  [address] ?  ? Report Status 08/21/2021 FINAL   ?   ? ?Assessment and Plan  ?Edema during pregnancy in third trimester  ?- Advised to increase oral water intake daily and elevate BLE as much as possible ?- Advised to NOT use wrist BP cuff to assess BP ? ?Visual disturbances ?- Advised not associated with gHTN or PEC at this time and could possibly be d/t the circumstances surrounding the onset of blurry vision and seeing floaters.  ?- Advised that coming from a hot shower can alter blood distribution in body and cause those sx's ?- Continue to monitor and keep OB  updated ? ?Dichorionic diamniotic twin pregnancy in third trimester ? ?[redacted] weeks gestation of pregnancy  ? ?- Discharge patient ?- Keep scheduled appt [facility] ?- Patient verbalized an understanding of the plan of care and ag

## 2021-08-20 NOTE — MAU Note (Signed)
Lauren Garner is a 36 y.o. at 96w4dhere in MAU reporting: yesterday her feet and legs were swollen so bad.  Today she started seeing floaters and had some blurring. Checked her BP 141/108,137/104, 131/81.  Dr CLandry Mellowwanted her to come be evaluated. Denies HA, epigastric pain. ? ?Onset of complaint: yesterday ?Pain score: rt leg/hip (5) and some indigestion 3 ?Vitals:  ? 08/20/21 1020  ?BP: 117/79  ?Pulse: 92  ?Resp: 17  ?Temp: 98.2 ?F (36.8 ?C)  ?SpO2: 100%  ?   ?FPPJ:KDTOIon, reports +FM ?Lab orders placed from triage:  uine ?

## 2021-08-20 NOTE — MAU Note (Signed)
Called [facility] regarding patient's Protein/Creatinine Ratio. Lab stated the original instrument that it was on crashed and had not been placed onto the new instrument. Lab to run the sample now. Lab stated it should be finished running within 30 minutes as long as it did not need to be diluted. CNM made aware.

## 2021-08-21 LAB — CULTURE, OB URINE

## 2021-08-23 ENCOUNTER — Encounter: Payer: Self-pay | Admitting: *Deleted

## 2021-08-23 ENCOUNTER — Ambulatory Visit: Payer: BC Managed Care – PPO | Admitting: *Deleted

## 2021-08-23 ENCOUNTER — Ambulatory Visit (HOSPITAL_BASED_OUTPATIENT_CLINIC_OR_DEPARTMENT_OTHER): Payer: BC Managed Care – PPO | Admitting: *Deleted

## 2021-08-23 ENCOUNTER — Ambulatory Visit: Payer: BC Managed Care – PPO | Attending: Obstetrics

## 2021-08-23 VITALS — BP 132/80 | HR 93

## 2021-08-23 DIAGNOSIS — O365932 Maternal care for other known or suspected poor fetal growth, third trimester, fetus 2: Secondary | ICD-10-CM | POA: Diagnosis not present

## 2021-08-23 DIAGNOSIS — Z3A30 30 weeks gestation of pregnancy: Secondary | ICD-10-CM

## 2021-08-23 DIAGNOSIS — Z6830 Body mass index (BMI) 30.0-30.9, adult: Secondary | ICD-10-CM

## 2021-08-23 DIAGNOSIS — O365931 Maternal care for other known or suspected poor fetal growth, third trimester, fetus 1: Secondary | ICD-10-CM | POA: Insufficient documentation

## 2021-08-23 DIAGNOSIS — O30043 Twin pregnancy, dichorionic/diamniotic, third trimester: Secondary | ICD-10-CM | POA: Diagnosis not present

## 2021-08-23 DIAGNOSIS — O09523 Supervision of elderly multigravida, third trimester: Secondary | ICD-10-CM | POA: Diagnosis not present

## 2021-08-24 NOTE — Procedures (Signed)
Lauren Garner ?Oct 26, 1985 ?[redacted]w[redacted]d? ? ?Fetus B Non-Stress Test Interpretation for 08/24/21 ? ?Indication: IUGR ? ?Fetal Heart Rate Fetus B ?Mode: External ?Baseline Rate (B): 140 BPM ?Variability: Moderate ?Accelerations: 15 x 15 ?Decelerations: None ? ?Uterine Activity ?Mode: Palpation, Toco ?Contraction Frequency (min): None ? ?Interpretation (Baby B - Fetal Testing) ?Nonstress Test Interpretation (Baby B): Reactive ?Comments (Baby B): Dr. FAnnamaria Bootsreviewed tracing. ? ?Lauren Garner?[birth year]?384w1d ?Fetus A Non-Stress Test Interpretation for 08/24/21 ? ?Indication: IUGR ? ?Fetal Heart Rate A ?Mode: External ?Baseline Rate (A): 145 bpm ?Variability: Moderate ?Accelerations: 15 x 15 ?Decelerations: None ?Multiple birth?: Yes ? ?Uterine Activity ?Mode: Palpation, Toco ?Contraction Frequency (min): None ? ?Interpretation (Fetal Testing) ?Nonstress Test Interpretation: Reactive ?Comments: Dr. FaAnnamaria Bootseviewed tracing. ? ? ?

## 2021-09-01 ENCOUNTER — Ambulatory Visit: Payer: BC Managed Care – PPO | Attending: Obstetrics

## 2021-09-01 ENCOUNTER — Other Ambulatory Visit: Payer: Self-pay | Admitting: *Deleted

## 2021-09-01 ENCOUNTER — Ambulatory Visit (HOSPITAL_BASED_OUTPATIENT_CLINIC_OR_DEPARTMENT_OTHER): Payer: BC Managed Care – PPO | Admitting: *Deleted

## 2021-09-01 ENCOUNTER — Ambulatory Visit (HOSPITAL_BASED_OUTPATIENT_CLINIC_OR_DEPARTMENT_OTHER): Payer: BC Managed Care – PPO | Admitting: Obstetrics

## 2021-09-01 ENCOUNTER — Ambulatory Visit: Payer: BC Managed Care – PPO | Admitting: *Deleted

## 2021-09-01 VITALS — BP 126/86 | HR 107

## 2021-09-01 DIAGNOSIS — O365930 Maternal care for other known or suspected poor fetal growth, third trimester, not applicable or unspecified: Secondary | ICD-10-CM | POA: Insufficient documentation

## 2021-09-01 DIAGNOSIS — O30043 Twin pregnancy, dichorionic/diamniotic, third trimester: Secondary | ICD-10-CM

## 2021-09-01 DIAGNOSIS — Z3A31 31 weeks gestation of pregnancy: Secondary | ICD-10-CM

## 2021-09-01 DIAGNOSIS — O34219 Maternal care for unspecified type scar from previous cesarean delivery: Secondary | ICD-10-CM

## 2021-09-01 DIAGNOSIS — O365932 Maternal care for other known or suspected poor fetal growth, third trimester, fetus 2: Secondary | ICD-10-CM

## 2021-09-01 DIAGNOSIS — O09213 Supervision of pregnancy with history of pre-term labor, third trimester: Secondary | ICD-10-CM

## 2021-09-01 DIAGNOSIS — O09523 Supervision of elderly multigravida, third trimester: Secondary | ICD-10-CM | POA: Diagnosis not present

## 2021-09-01 DIAGNOSIS — O365931 Maternal care for other known or suspected poor fetal growth, third trimester, fetus 1: Secondary | ICD-10-CM

## 2021-09-01 NOTE — Procedures (Signed)
Lauren Garner ?04-Apr-1986 ?[redacted]w[redacted]d? ?Fetus A Non-Stress Test Interpretation for 09/01/21 ? ?Indication: IUGR ? ?Fetal Heart Rate A ?Mode: External ?Baseline Rate (A): 140 bpm ?Variability: Moderate ?Accelerations: 15 x 15 ?Decelerations: None ?Multiple birth?: Yes ? ?Uterine Activity ?Mode: Palpation, Toco ?Contraction Frequency (min): None ? ?Interpretation (Fetal Testing) ?Nonstress Test Interpretation: Reactive ?Comments: Dr. FAnnamaria Bootsreviewed tracing. ? ?Lauren Garner?[birth year]?317w2d ? ?Fetus B Non-Stress Test Interpretation for 09/01/21 ? ?Indication: IUGR ? ?Fetal Heart Rate Fetus B ?Mode: External ?Baseline Rate (B): 140 BPM ?Variability: Moderate ?Accelerations: 15 x 15 ?Decelerations: None ? ?Uterine Activity ?Mode: Palpation, Toco ?Contraction Frequency (min): None ? ?Interpretation (Baby B - Fetal Testing) ?Nonstress Test Interpretation (Baby B): Reactive ?Comments (Baby B): Dr. FaAnnamaria Bootseviewed tracing. ? ? ?

## 2021-09-01 NOTE — Progress Notes (Signed)
MFM Note ? ?Lauren Garner was seen due to fetal growth restriction of both fetuses in a dichorionic, diamniotic twin gestation.  She denies any problems since her last exam.  She reports feeling vigorous fetal movements of both fetuses throughout the day. ? ?Twin A: EFW 2 pounds (less than the 1st percentile).  Twin A has grown over half a pound over the last 3 weeks. ? ?Twin B: EFW 2 pounds 15 ounces (2nd percentile for her gestational age).  Twin B has grown over half a pound over the past 3 weeks. ? ?There was normal amniotic fluid noted on today's ultrasound exam around both twin A and twin B. ? ?Doppler studies of the umbilical arteries performed due to fetal growth restriction showed continued forward flow in both twin A and twin B.  There were no signs of absent or reversed end-diastolic flow noted today in either fetus. ?The patient had a reactive NST for both twin A and twin B today. ? ?Due to severe IUGR in a dichorionic twin gestation, delivery is recommended at between 60 to 43 weeks.   ? ?The patient will have a repeat cesarean delivery scheduled on Sep 24, 2021, when she will be 34 weeks and 5 days. ? ?We will plan on administering a complete course of antenatal corticosteroids prior to delivery. ? ?The patient understands that her babies will require a NICU admission following delivery.   ? ?She is agreeable and comfortable with the plan for delivery.   ? ?She will return in 1 week for another umbilical artery Doppler study and BPP.  ? ?A total of 20 minutes was spent counseling and coordinating the care for this patient.  Greater than 50% of the time was spent in direct face-to-face contact. ?

## 2021-09-07 ENCOUNTER — Other Ambulatory Visit: Payer: Self-pay | Admitting: Obstetrics

## 2021-09-07 ENCOUNTER — Ambulatory Visit: Payer: BC Managed Care – PPO | Admitting: *Deleted

## 2021-09-07 ENCOUNTER — Ambulatory Visit: Payer: BC Managed Care – PPO | Attending: Obstetrics

## 2021-09-07 ENCOUNTER — Encounter: Payer: Self-pay | Admitting: *Deleted

## 2021-09-07 VITALS — BP 126/74 | HR 85

## 2021-09-07 DIAGNOSIS — O34219 Maternal care for unspecified type scar from previous cesarean delivery: Secondary | ICD-10-CM

## 2021-09-07 DIAGNOSIS — O365931 Maternal care for other known or suspected poor fetal growth, third trimester, fetus 1: Secondary | ICD-10-CM | POA: Diagnosis not present

## 2021-09-07 DIAGNOSIS — O09213 Supervision of pregnancy with history of pre-term labor, third trimester: Secondary | ICD-10-CM | POA: Diagnosis not present

## 2021-09-07 DIAGNOSIS — O30043 Twin pregnancy, dichorionic/diamniotic, third trimester: Secondary | ICD-10-CM

## 2021-09-07 DIAGNOSIS — O09523 Supervision of elderly multigravida, third trimester: Secondary | ICD-10-CM | POA: Diagnosis not present

## 2021-09-07 DIAGNOSIS — Z3A32 32 weeks gestation of pregnancy: Secondary | ICD-10-CM

## 2021-09-07 DIAGNOSIS — O365930 Maternal care for other known or suspected poor fetal growth, third trimester, not applicable or unspecified: Secondary | ICD-10-CM

## 2021-09-10 ENCOUNTER — Encounter (HOSPITAL_COMMUNITY): Payer: Self-pay

## 2021-09-10 ENCOUNTER — Telehealth (HOSPITAL_COMMUNITY): Payer: Self-pay | Admitting: *Deleted

## 2021-09-10 NOTE — Telephone Encounter (Signed)
Preadmission screen  

## 2021-09-12 ENCOUNTER — Encounter (HOSPITAL_COMMUNITY): Payer: Self-pay | Admitting: Obstetrics and Gynecology

## 2021-09-12 ENCOUNTER — Inpatient Hospital Stay (HOSPITAL_COMMUNITY)
Admission: AD | Admit: 2021-09-12 | Discharge: 2021-09-24 | DRG: 788 | Disposition: A | Discharge status: Home / Self Care | Payer: BC Managed Care – PPO | Attending: Obstetrics and Gynecology | Admitting: Obstetrics and Gynecology

## 2021-09-12 ENCOUNTER — Other Ambulatory Visit: Payer: Self-pay

## 2021-09-12 DIAGNOSIS — K5900 Constipation, unspecified: Secondary | ICD-10-CM | POA: Diagnosis not present

## 2021-09-12 DIAGNOSIS — O42913 Preterm premature rupture of membranes, unspecified as to length of time between rupture and onset of labor, third trimester: Secondary | ICD-10-CM | POA: Diagnosis present

## 2021-09-12 DIAGNOSIS — O09213 Supervision of pregnancy with history of pre-term labor, third trimester: Secondary | ICD-10-CM | POA: Diagnosis not present

## 2021-09-12 DIAGNOSIS — R110 Nausea: Secondary | ICD-10-CM | POA: Diagnosis not present

## 2021-09-12 DIAGNOSIS — D259 Leiomyoma of uterus, unspecified: Secondary | ICD-10-CM | POA: Diagnosis present

## 2021-09-12 DIAGNOSIS — O3413 Maternal care for benign tumor of corpus uteri, third trimester: Secondary | ICD-10-CM | POA: Diagnosis present

## 2021-09-12 DIAGNOSIS — O34211 Maternal care for low transverse scar from previous cesarean delivery: Secondary | ICD-10-CM | POA: Diagnosis present

## 2021-09-12 DIAGNOSIS — O09523 Supervision of elderly multigravida, third trimester: Secondary | ICD-10-CM | POA: Diagnosis not present

## 2021-09-12 DIAGNOSIS — O134 Gestational [pregnancy-induced] hypertension without significant proteinuria, complicating childbirth: Secondary | ICD-10-CM | POA: Diagnosis present

## 2021-09-12 DIAGNOSIS — O365992 Maternal care for other known or suspected poor fetal growth, unspecified trimester, fetus 2: Secondary | ICD-10-CM

## 2021-09-12 DIAGNOSIS — O30043 Twin pregnancy, dichorionic/diamniotic, third trimester: Secondary | ICD-10-CM | POA: Diagnosis present

## 2021-09-12 DIAGNOSIS — O42919 Preterm premature rupture of membranes, unspecified as to length of time between rupture and onset of labor, unspecified trimester: Principal | ICD-10-CM | POA: Diagnosis present

## 2021-09-12 DIAGNOSIS — O365932 Maternal care for other known or suspected poor fetal growth, third trimester, fetus 2: Secondary | ICD-10-CM | POA: Diagnosis present

## 2021-09-12 DIAGNOSIS — J45909 Unspecified asthma, uncomplicated: Secondary | ICD-10-CM | POA: Diagnosis present

## 2021-09-12 DIAGNOSIS — Z3A33 33 weeks gestation of pregnancy: Secondary | ICD-10-CM | POA: Diagnosis not present

## 2021-09-12 DIAGNOSIS — Z3A32 32 weeks gestation of pregnancy: Secondary | ICD-10-CM | POA: Diagnosis not present

## 2021-09-12 DIAGNOSIS — O9952 Diseases of the respiratory system complicating childbirth: Secondary | ICD-10-CM | POA: Diagnosis present

## 2021-09-12 DIAGNOSIS — O365931 Maternal care for other known or suspected poor fetal growth, third trimester, fetus 1: Secondary | ICD-10-CM | POA: Diagnosis present

## 2021-09-12 DIAGNOSIS — O99892 Other specified diseases and conditions complicating childbirth: Secondary | ICD-10-CM | POA: Diagnosis present

## 2021-09-12 DIAGNOSIS — O99214 Obesity complicating childbirth: Secondary | ICD-10-CM | POA: Diagnosis present

## 2021-09-12 DIAGNOSIS — R7303 Prediabetes: Secondary | ICD-10-CM | POA: Diagnosis present

## 2021-09-12 DIAGNOSIS — O42113 Preterm premature rupture of membranes, onset of labor more than 24 hours following rupture, third trimester: Secondary | ICD-10-CM | POA: Diagnosis not present

## 2021-09-12 DIAGNOSIS — O365922 Maternal care for other known or suspected poor fetal growth, second trimester, fetus 2: Secondary | ICD-10-CM | POA: Diagnosis not present

## 2021-09-12 LAB — CBC WITH DIFFERENTIAL/PLATELET
Abs Immature Granulocytes: 0.03 10*3/uL (ref 0.00–0.07)
Basophils Absolute: 0 10*3/uL (ref 0.0–0.1)
Basophils Relative: 0 %
Eosinophils Absolute: 0.1 10*3/uL (ref 0.0–0.5)
Eosinophils Relative: 1 %
HCT: 37.7 % (ref 36.0–46.0)
Hemoglobin: 12.2 g/dL (ref 12.0–15.0)
Immature Granulocytes: 0 %
Lymphocytes Relative: 46 %
Lymphs Abs: 4.2 10*3/uL — ABNORMAL HIGH (ref 0.7–4.0)
MCH: 29.7 pg (ref 26.0–34.0)
MCHC: 32.4 g/dL (ref 30.0–36.0)
MCV: 91.7 fL (ref 80.0–100.0)
Monocytes Absolute: 0.7 10*3/uL (ref 0.1–1.0)
Monocytes Relative: 7 %
Neutro Abs: 4.2 10*3/uL (ref 1.7–7.7)
Neutrophils Relative %: 46 %
Platelets: 244 10*3/uL (ref 150–400)
RBC: 4.11 MIL/uL (ref 3.87–5.11)
RDW: 13 % (ref 11.5–15.5)
WBC: 9.2 10*3/uL (ref 4.0–10.5)
nRBC: 0 % (ref 0.0–0.2)

## 2021-09-12 LAB — COMPREHENSIVE METABOLIC PANEL
ALT: 20 U/L (ref 0–44)
AST: 27 U/L (ref 15–41)
Albumin: 2.8 g/dL — ABNORMAL LOW (ref 3.5–5.0)
Alkaline Phosphatase: 148 U/L — ABNORMAL HIGH (ref 38–126)
Anion gap: 7 (ref 5–15)
BUN: 7 mg/dL (ref 6–20)
CO2: 20 mmol/L — ABNORMAL LOW (ref 22–32)
Calcium: 9.5 mg/dL (ref 8.9–10.3)
Chloride: 109 mmol/L (ref 98–111)
Creatinine, Ser: 0.82 mg/dL (ref 0.44–1.00)
GFR, Estimated: >60 mL/min (ref >60)
Glucose, Bld: 88 mg/dL (ref 70–99)
Potassium: 3.9 mmol/L (ref 3.5–5.1)
Sodium: 136 mmol/L (ref 135–145)
Total Bilirubin: 0.4 mg/dL (ref 0.3–1.2)
Total Protein: 6.7 g/dL (ref 6.5–8.1)

## 2021-09-12 LAB — URINALYSIS, ROUTINE W REFLEX MICROSCOPIC
Bilirubin Urine: NEGATIVE
Glucose, UA: NEGATIVE mg/dL
Hgb urine dipstick: NEGATIVE
Ketones, ur: NEGATIVE mg/dL
Leukocytes,Ua: NEGATIVE
Nitrite: NEGATIVE
Protein, ur: NEGATIVE mg/dL
Specific Gravity, Urine: 1.016 (ref 1.005–1.030)
pH: 5 (ref 5.0–8.0)

## 2021-09-12 LAB — RPR: RPR Ser Ql: NONREACTIVE

## 2021-09-12 LAB — TYPE AND SCREEN
ABO/RH(D): B POS
Antibody Screen: NEGATIVE

## 2021-09-12 LAB — WET PREP, GENITAL
Clue Cells Wet Prep HPF POC: NONE SEEN
Sperm: NONE SEEN
Trich, Wet Prep: NONE SEEN
WBC, Wet Prep HPF POC: <10 (ref <10)
Yeast Wet Prep HPF POC: NONE SEEN

## 2021-09-12 LAB — AMNISURE RUPTURE OF MEMBRANE (ROM) NOT AT ARMC: Amnisure ROM: POSITIVE

## 2021-09-12 LAB — GROUP B STREP BY PCR: Group B strep by PCR: NEGATIVE

## 2021-09-12 MED ORDER — AMOXICILLIN 500 MG PO CAPS: 500.0000 mg | ORAL_CAPSULE | Freq: Three times a day (TID) | ORAL | Status: DC

## 2021-09-12 MED ORDER — PRENATAL MULTIVITAMIN CH
1.0000 | ORAL_TABLET | Freq: Every day | ORAL | Status: DC
Start: 2021-09-12 — End: 2021-09-22
  Administered 2021-09-12 – 2021-09-21 (×8): 1 via ORAL
  Filled 2021-09-12 (×8): qty 1

## 2021-09-12 MED ORDER — FERROUS SULFATE 325 (65 FE) MG PO TABS
325.0000 mg | ORAL_TABLET | Freq: Every day | ORAL | Status: DC
  Administered 2021-09-13 – 2021-09-21 (×9): 325 mg via ORAL
  Filled 2021-09-12 (×9): qty 1

## 2021-09-12 MED ORDER — ALBUTEROL SULFATE (2.5 MG/3ML) 0.083% IN NEBU: 2.5000 mg | INHALATION_SOLUTION | RESPIRATORY_TRACT | Status: DC | PRN

## 2021-09-12 MED ORDER — ZOLPIDEM TARTRATE 5 MG PO TABS: 5.0000 mg | ORAL_TABLET | Freq: Every evening | ORAL | Status: DC | PRN

## 2021-09-12 MED ORDER — BETAMETHASONE SOD PHOS & ACET 6 (3-3) MG/ML IJ SUSP
12.0000 mg | INTRAMUSCULAR | Status: AC
  Administered 2021-09-12 – 2021-09-13 (×2): 12 mg via INTRAMUSCULAR
  Filled 2021-09-12 (×2): qty 5

## 2021-09-12 MED ORDER — SODIUM CHLORIDE 0.9 % IV SOLN
2.0000 g | Freq: Four times a day (QID) | INTRAVENOUS | Status: AC
Start: 2021-09-12 — End: 2021-09-13
  Administered 2021-09-12 – 2021-09-13 (×8): 2 g via INTRAVENOUS
  Filled 2021-09-12 (×8): qty 2000

## 2021-09-12 MED ORDER — ACETAMINOPHEN 325 MG PO TABS: 650.0000 mg | ORAL_TABLET | ORAL | Status: DC | PRN

## 2021-09-12 MED ORDER — DOCUSATE SODIUM 100 MG PO CAPS
100.0000 mg | ORAL_CAPSULE | Freq: Two times a day (BID) | ORAL | Status: DC
  Administered 2021-09-12 – 2021-09-21 (×20): 100 mg via ORAL
  Filled 2021-09-12 (×20): qty 1

## 2021-09-12 MED ORDER — LACTATED RINGERS IV SOLN: INTRAVENOUS | Status: DC

## 2021-09-12 MED ORDER — DOCUSATE SODIUM 100 MG PO CAPS
100.0000 mg | ORAL_CAPSULE | Freq: Every day | ORAL | Status: DC
Start: 2021-09-12 — End: 2021-09-12

## 2021-09-12 MED ORDER — AZITHROMYCIN 1 G PO PACK
1.0000 g | PACK | Freq: Once | ORAL | Status: AC
Start: 2021-09-12 — End: 2021-09-12
  Administered 2021-09-12: 1 g via ORAL
  Filled 2021-09-12: qty 1

## 2021-09-12 MED ORDER — CALCIUM CARBONATE ANTACID 500 MG PO CHEW: 2.0000 | CHEWABLE_TABLET | ORAL | Status: DC | PRN

## 2021-09-12 MED ORDER — ASPIRIN 81 MG PO CHEW
81.0000 mg | CHEWABLE_TABLET | Freq: Every day | ORAL | Status: DC
  Administered 2021-09-12 – 2021-09-21 (×10): 81 mg via ORAL
  Filled 2021-09-12 (×10): qty 1

## 2021-09-12 MED ORDER — AMOXICILLIN 500 MG PO CAPS
500.0000 mg | ORAL_CAPSULE | Freq: Three times a day (TID) | ORAL | Status: AC
  Administered 2021-09-14 – 2021-09-18 (×15): 500 mg via ORAL
  Filled 2021-09-12 (×15): qty 1

## 2021-09-12 MED ORDER — AMPICILLIN SODIUM 2 G IJ SOLR
2.0000 g | Freq: Four times a day (QID) | INTRAMUSCULAR | Status: DC
Start: 2021-09-12 — End: 2021-09-12

## 2021-09-12 NOTE — H&P (Addendum)
Lauren Garner is a 36 y.o. female presenting for PPROM of Twin A with Di/Di twins followed by MFM, Dr. Annamaria Boots for FGR of both twins.  Pt initially scheduled for repeat c-section at 46 5/7wks.  Denies bleeding or ctxs and reports lots of fetal movement.  She does report an occasional BH ctx or cramping. ? ?OB History   ? ? Gravida  ?2  ? Para  ?1  ? Term  ?   ? Preterm  ?1  ? AB  ?   ? Living  ?1  ?  ? ? SAB  ?   ? IAB  ?   ? Ectopic  ?   ? Multiple  ?   ? Live Births  ?1  ?   ?  ?  ? ?Past Medical History:  ?Diagnosis Date  ? Asthma   ? ?Past Surgical History:  ?Procedure Laterality Date  ? CESAREAN SECTION    ? ?Family History: family history includes Cancer in her father and mother; Hyperlipidemia in her father and mother; Hypertension in her mother. ?Social History:  reports that she has never smoked. She has never used smokeless tobacco. She reports that she does not currently use alcohol. She reports that she does not use drugs. ? ? ?  ?Maternal Diabetes: No ?Genetic Screening: Normal ?Maternal Ultrasounds/Referrals: IUGR ?Fetal Ultrasounds or other Referrals:  Referred to [facility]  ?Maternal Substance Abuse:  No ?Significant Maternal Medications:  Meds include: Other: '81mg'$  aspirin, PNV, colace and iron ?Significant Maternal Lab Results:  Other: GBS PCR pending, GC/CT pending, Urine Cx pending ?Other Comments:   h/o asthma but no recent exacerbations ? ?Review of Systems ?As above and denies f/c/n/v/d ?History ?  ?Blood pressure 128/82, pulse (!) 103, temperature 98.2 ?F (36.8 ?C), temperature source Oral, resp. rate 20, height '5\' 2"'$  (1.575 m), weight 85 kg, last menstrual period 01/25/2021, SpO2 100 %. ?Exam ?Physical Exam  ?Lungs CTA ?CV RRR ?Abdomen gravid, NT ?Ext SCDs are on and no calf tenderness ?FHT x 2 cat 1 ?Toco rare ?Cvx closed/50/-3 ? ?Prenatal labs: ?ABO, Rh: --/--/B POS (05/14 3016) ?Antibody: NEG (05/14 0549) ?Rubella:  unable to see PN records ?RPR: NON REACTIVE (05/14 0549)   ?HBsAg:   unable to see PN records ?HIV:   unable to see PN records ?GBS:   pending ? ?Assessment/Plan: ?P1 at 69 6/7wks admitted with PPROM no current s/sxs of labor. GBS/GC/CT/Urine cx/wet prep/RPR pending.  Rubella, HBsAg and HIV can be obtained from PN records tomorrow when I sign out to Dr. Delora Fuel.  BMZ series and latency abx ordered (azithromycin and amp/amoxicillin).  MFM consult ordered along with ultrasound with dopplers.  I discussed with the patient likely recommendation for delivery once BMZ is complete.  She is agreeable.  Questions answered.  Denies h/o HSV.  Fetal status x 2 cat 1.  Continuous monitoring for now. ? ?Delice Lesch ?09/12/2021, 9:34 AM ? ? ? ? ?

## 2021-09-12 NOTE — MAU Note (Signed)
Lauren Garner is a 36 y.o. at 64w6dhere in MAU reporting: woke up stomach was hurting when she got up she felt something coming out she put a towel between her legs and when she moved the towel water gushed out. States she has been leaking ever since. Denies any VB. +FM from both babies. States she has had ctx since last Saturday but once this gush of fluid happened she has had intermittent lower back pain.  ? ?Onset of complaint: 0235 ?Pain score: 5 ?Vitals:  ? 09/12/21 0409  ?BP: 133/88  ?Pulse: (!) 103  ?Resp: 20  ?Temp: 98.3 ?F (36.8 ?C)  ?SpO2: 98%  ? ?   ?FHT: pt has twins - will apply FHR monitors in room. ?Lab orders placed from triage: u/a  ? ?

## 2021-09-12 NOTE — MAU Provider Note (Addendum)
S: Lauren Garner is a 36 y.o. G2P0101 at [redacted]w[redacted]d who presents to MAU today complaining of leaking of fluid since 3 amm. She denies vaginal bleeding. She denies contractions. She reports normal fetal movement.  She has a Di-Di pregnancy complicated by IUGR in both babies; she has had Normal Doppler studies thus far. She is scheduled for c/section on 09/24/2021 (repeat).  ? ?O: BP 133/88 (BP Location: Right Arm)   Pulse (!) 103   Temp 98.3 ?F (36.8 ?C) (Oral)   Resp 20   Ht '5\' 2"'$  (1.575 m)   Wt 85 kg   LMP 01/25/2021   SpO2 98%   BMI 34.28 kg/m?  ?GENERAL: Well-developed, well-nourished female in no acute distress.  ?HEAD: Normocephalic, atraumatic.  ?CHEST: Normal effort of breathing, regular heart rate ?ABDOMEN: Soft, nontender, gravid ? ?Cervical exam: deferred ?  ? ? ?Fetal Monitoring: ?Baseline: Baby A: 140 ?Baby B:  135 ?Variability: mod  ?Accelerations: present ?Decelerations: none ?Contractions: None ? ?Results for orders placed or performed during the hospital encounter of 09/12/21 (from the past 24 hour(s))  ?[facility]    Status: None  ? Collection Time: 09/12/21  4:45 AM  ?Result Value Ref Range  ? Amnisure ROM POSITIVE   ? ? ? ?A: ?SIUP at 334w6d?SROM ? ?P: ? ?TC to Dr. RoMancel Baleo discuss plan of care, gave updates on patient's tracing, complaint, pregnancy history. Recommend Admission and BMZ; Dr. RoMancel Balegrees and will come to assess patient. [facility] RoMancel Balessumes care of this patient.  ?Patient agrees with plan of care.  ? ? ?KoStarr LakeCNM ?09/12/2021 5:36 AM ?

## 2021-09-12 NOTE — Consult Note (Signed)
 [facility] ? ?Prenatal Consult      09/12/2021   4:33 PM ? ?I was asked by Dr. Mancel Bale to consult on this patient for possible preterm delivery. I had the pleasure of meeting with Lauren Garner and her husband today.  ? ?She is a 36 yo G97P0101 female presenting at 24w6ddi/di twin IUP with concerns for PPROM. Pregnancy has also been complicated by FGR of both twins (Most recent EFW 5/9 Boy A: 917g (<1%), Girl B: 1330g (1%); 31% discordance). She has received BMZ x1 today, and delivery is planned following completion of BMZ course. She is not currently showing signs of labor or infection. ? ?We discussed the possible needs for infants born at this gestation. I explained that the neonatal intensive care team would be present for the delivery and outlined the likely delivery room course for this baby including routine resuscitation and NRP-guided approaches to the treatment of respiratory distress. We discussed the potential need for CPAP or mechanical ventilation and surfactant administration for respiratory distress, IV fluids pending establishment of enteral feeds, temperature support, and monitoring. We discussed other common problems associated with prematurity including respiratory distress syndrome, feeding issues, temperature regulation, and infection risk. ? ?We discussed the importance of good nutrition and various methods of providing nutrition (parenteral hyperalimentation, gavage feedings and/or oral feeding). We discussed the benefits of human milk, and she desires to breastfeed. I encouraged breast feeding and pumping soon after birth and outlined resources that are available to support breast feeding. We discussed the possibility of using donor breast milk as a bridge. ? ?We discussed the average length of stay but I noted that the actual LOS would depend on the severity of problems encountered and response to treatments. We discussed visitation policies and the  resources available while their children are in the hospital. ? ?They expressed understanding and agreement with the plan for resuscitation and intensive care. All questions were answered. ? ?Thank you for involving [facility] the care of this patient. A member of our team will be available should the family have additional questions.  ? ?SClarnce Flock MD ?Neonatal Medicine ? ?I spent ~40 minutes in consultation time, of which 25 minutes was spent in direct face to face counseling.  ? ?

## 2021-09-13 ENCOUNTER — Inpatient Hospital Stay (HOSPITAL_BASED_OUTPATIENT_CLINIC_OR_DEPARTMENT_OTHER): Payer: BC Managed Care – PPO

## 2021-09-13 ENCOUNTER — Inpatient Hospital Stay (HOSPITAL_COMMUNITY): Admit: 2021-09-13 | Payer: BC Managed Care – PPO | Admitting: Obstetrics and Gynecology

## 2021-09-13 DIAGNOSIS — Z3A33 33 weeks gestation of pregnancy: Secondary | ICD-10-CM

## 2021-09-13 DIAGNOSIS — O30043 Twin pregnancy, dichorionic/diamniotic, third trimester: Secondary | ICD-10-CM | POA: Diagnosis not present

## 2021-09-13 DIAGNOSIS — O365931 Maternal care for other known or suspected poor fetal growth, third trimester, fetus 1: Secondary | ICD-10-CM

## 2021-09-13 DIAGNOSIS — O42913 Preterm premature rupture of membranes, unspecified as to length of time between rupture and onset of labor, third trimester: Secondary | ICD-10-CM

## 2021-09-13 DIAGNOSIS — O09213 Supervision of pregnancy with history of pre-term labor, third trimester: Secondary | ICD-10-CM

## 2021-09-13 DIAGNOSIS — O365932 Maternal care for other known or suspected poor fetal growth, third trimester, fetus 2: Secondary | ICD-10-CM

## 2021-09-13 DIAGNOSIS — O42113 Preterm premature rupture of membranes, onset of labor more than 24 hours following rupture, third trimester: Secondary | ICD-10-CM

## 2021-09-13 DIAGNOSIS — O09523 Supervision of elderly multigravida, third trimester: Secondary | ICD-10-CM | POA: Diagnosis not present

## 2021-09-13 DIAGNOSIS — O365922 Maternal care for other known or suspected poor fetal growth, second trimester, fetus 2: Secondary | ICD-10-CM

## 2021-09-13 LAB — GC/CHLAMYDIA PROBE AMP (~~LOC~~) NOT AT ARMC
Chlamydia: NEGATIVE
Comment: NEGATIVE
Comment: NORMAL
Neisseria Gonorrhea: NEGATIVE

## 2021-09-13 LAB — CULTURE, OB URINE

## 2021-09-13 MED ORDER — METHYLERGONOVINE MALEATE 0.2 MG/ML IJ SOLN
INTRAMUSCULAR | Status: AC
  Filled 2021-09-13: qty 3

## 2021-09-13 MED ORDER — MISOPROSTOL 200 MCG PO TABS
ORAL_TABLET | ORAL | Status: AC
  Filled 2021-09-13: qty 5

## 2021-09-13 MED ORDER — FAMOTIDINE IN NACL 20-0.9 MG/50ML-% IV SOLN
20.0000 mg | Freq: Two times a day (BID) | INTRAVENOUS | Status: DC | PRN
  Administered 2021-09-13: 20 mg via INTRAVENOUS
  Filled 2021-09-13: qty 50

## 2021-09-13 MED ORDER — TRANEXAMIC ACID-NACL 1000-0.7 MG/100ML-% IV SOLN
INTRAVENOUS | Status: AC
  Filled 2021-09-13: qty 100

## 2021-09-13 MED ORDER — FENTANYL CITRATE (PF) 100 MCG/2ML IJ SOLN
INTRAMUSCULAR | Status: AC
  Filled 2021-09-13: qty 4

## 2021-09-13 MED ORDER — OXYTOCIN-SODIUM CHLORIDE 30-0.9 UT/500ML-% IV SOLN
INTRAVENOUS | Status: AC
  Filled 2021-09-13: qty 500

## 2021-09-13 MED ORDER — CARBOPROST TROMETHAMINE 250 MCG/ML IM SOLN
INTRAMUSCULAR | Status: AC
  Filled 2021-09-13: qty 1

## 2021-09-13 NOTE — Consult Note (Signed)
MFM Note ? ?Lauren Garner is currently at 33 weeks and 0 days with a dichorionic, diamniotic twin gestation where IUGR of both twin A and twin B has been noted since earlier in her pregnancy.   ? ?She was seen in consultation at the request of Dr. Delora Fuel due to [facility] of twin A.  The patient reports that she ruptured membranes yesterday morning.  She continues to leak fluid.  She denies any signs or symptoms of an intrauterine infection. ? ?The fetal status of both fetuses have been reassuring since admission.  She had a reactive NST for both fetuses today. ? ?A BPP performed today was 8 out of 8 for both twin A and twin B.   ? ?Doppler studies of the umbilical arteries for both fetuses showed a normal S/D ratio without any signs of absent or reversed end-diastolic flow.  Both fetuses were in the vertex/vertex presentations.   ? ?Oligohydramnios was noted in twin A.  There was normal amniotic fluid noted in twin B. ? ?The most recent EFW's performed on Sep 01, 2021 for twin A was 2 pounds (917 g, less than the 1st percentile) and for twin B was 2 pounds 15 ounces (1330 g, 2nd percentile). ? ?Due to PPROM, she has already received a complete course of antenatal corticosteroids and is receiving latency antibiotics. ? ?The usual management and implications of PPROM were discussed with the patient. She was advised that due to rupture of membranes, she will require inpatient management until delivery with daily fetal testing.   ? ?Due to PPROM, she should be delivered at around 34 weeks.  She should continue latency antibiotics until delivery. ? ?Delivery prior to 34 weeks would be indicated for nonreassuring fetal status, should she go into spontaneous labor, or should she show any signs of an intrauterine infection. ? ?The patient and her husband are happy and agreeable with having a repeat cesarean delivery scheduled at 34 weeks.   ? ?They understand that the babies will require a NICU admission after  birth. ? ?The patient and her husband stated that all of their questions have been answered. ?

## 2021-09-13 NOTE — OR Nursing (Signed)
Patient to Ultrasound for AFI/BPP.  Patient denies having any contractions and feeling babies move well.  Clear Colored Vaginal Fluid small amount when up to Bathroom.  ?

## 2021-09-13 NOTE — Progress Notes (Signed)
Antepartum Progress Note ? ?Pregnancy complications:  ?Dichorionic/Diamniotic twin gestation ?FGR of both twins ?History of CS x 1 (desires repeat) ?AMA (36) ?Obesity (BMI 34) ?Asthma (Trelegy PRN) ?Pre-diabetes ? ?PPROM Date: 09/12/2021 ? ?Subjective: Patient doing well. Reports occasional contractions but nothing significant. Endorses good FM. Denies VB, LOF, or regular CTX. ? ?Denies fevers, chills, chest pain, visual changes, SOB, RUQ/epigastric pain, N/V, dysuria, hematuria, or sudden onset/worsening bilateral LE or facial edema. ? ?Objective: ?BP (!) 105/47 (BP Location: Left Arm)   Pulse 78   Temp 98.5 ?F (36.9 ?C) (Oral)   Resp 17   Ht '5\' 2"'$  (1.575 m)   Wt 85 kg   LMP 01/25/2021   SpO2 99%   BMI 34.28 kg/m?  ?Gen:  NAD, pleasant and cooperative ?Cardio:  RRR ?Pulm:  CTAB, no wheezes/rales/rhonchi ?Abd:  Soft, gravid, non-distended, non-tender throughout, no rebound/guarding, no fundal tenderness ?Ext:  No bilateral LE edema, no bilateral calf tenderness ? ?FHT A: 125bpm, moderate variability, + accel, - decel ?FHT B: 125bpm, moderate variability, + accel, - decel ?Toco: None ? ?Results for orders placed or performed during the hospital encounter of 09/12/21  ?Group B strep by PCR  ? Specimen: Vaginal/Rectal; Genital  ?Result Value Ref Range  ? Group B strep by PCR NEGATIVE NEGATIVE  ?Wet prep, genital  ?Result Value Ref Range  ? Yeast Wet Prep HPF POC NONE SEEN NONE SEEN  ? Trich, Wet Prep NONE SEEN NONE SEEN  ? Clue Cells Wet Prep HPF POC NONE SEEN NONE SEEN  ? WBC, Wet Prep HPF POC <10 <10  ? Sperm NONE SEEN   ?Amnisure rupture of membrane (rom)not at [facility]  ?Result Value Ref Range  ? Amnisure ROM POSITIVE   ?Urinalysis, Routine w reflex microscopic Urine, Clean Catch  ?Result Value Ref Range  ? Color, Urine YELLOW YELLOW  ? APPearance HAZY (A) CLEAR  ? Specific Gravity, Urine 1.016 1.005 - 1.030  ? pH 5.0 5.0 - 8.0  ? Glucose, UA NEGATIVE NEGATIVE mg/dL  ? Hgb urine dipstick NEGATIVE NEGATIVE  ?  Bilirubin Urine NEGATIVE NEGATIVE  ? Ketones, ur NEGATIVE NEGATIVE mg/dL  ? Protein, ur NEGATIVE NEGATIVE mg/dL  ? Nitrite NEGATIVE NEGATIVE  ? Leukocytes,Ua NEGATIVE NEGATIVE  ?CBC with Differential/Platelet  ?Result Value Ref Range  ? WBC 9.2 4.0 - 10.5 K/uL  ? RBC 4.11 3.87 - 5.11 MIL/uL  ? Hemoglobin 12.2 12.0 - 15.0 g/dL  ? HCT 37.7 36.0 - 46.0 %  ? MCV 91.7 80.0 - 100.0 fL  ? MCH 29.7 26.0 - 34.0 pg  ? MCHC 32.4 30.0 - 36.0 g/dL  ? RDW 13.0 11.5 - 15.5 %  ? Platelets 244 150 - 400 K/uL  ? nRBC 0.0 0.0 - 0.2 %  ? Neutrophils Relative % 46 %  ? Neutro Abs 4.2 1.7 - 7.7 K/uL  ? Lymphocytes Relative 46 %  ? Lymphs Abs 4.2 (H) 0.7 - 4.0 K/uL  ? Monocytes Relative 7 %  ? Monocytes Absolute 0.7 0.1 - 1.0 K/uL  ? Eosinophils Relative 1 %  ? Eosinophils Absolute 0.1 0.0 - 0.5 K/uL  ? Basophils Relative 0 %  ? Basophils Absolute 0.0 0.0 - 0.1 K/uL  ? Immature Granulocytes 0 %  ? Abs Immature Granulocytes 0.03 0.00 - 0.07 K/uL  ?Comprehensive metabolic panel  ?Result Value Ref Range  ? Sodium 136 135 - 145 mmol/L  ? Potassium 3.9 3.5 - 5.1 mmol/L  ? Chloride 109 98 - 111 mmol/L  ?  CO2 20 (L) 22 - 32 mmol/L  ? Glucose, Bld 88 70 - 99 mg/dL  ? BUN 7 6 - 20 mg/dL  ? Creatinine, Ser 0.82 0.44 - 1.00 mg/dL  ? Calcium 9.5 8.9 - 10.3 mg/dL  ? Total Protein 6.7 6.5 - 8.1 g/dL  ? Albumin 2.8 (L) 3.5 - 5.0 g/dL  ? AST 27 15 - 41 U/L  ? ALT 20 0 - 44 U/L  ? Alkaline Phosphatase 148 (H) 38 - 126 U/L  ? Total Bilirubin 0.4 0.3 - 1.2 mg/dL  ? GFR, Estimated >60 >60 mL/min  ? Anion gap 7 5 - 15  ?RPR  ?Result Value Ref Range  ? RPR Ser Ql NON REACTIVE NON REACTIVE  ?Type and screen [place]  ?Result Value Ref Range  ? ABO/RH(D) B POS   ? Antibody Screen NEG   ? Sample Expiration    ?  09/15/2021,2359 ?Performed at [facility], [place] [address] [place], [place] [address] ?  ? ? ? ?A/P: ?Lauren Garner is a 36 y.o. G2P0101 @ 73w0dadmitted for PPROM of Twin A in Di/Di Twin Gestation. ? ?PPROM ?- Doing  well ?- Latency Abx: Azithromycin, Amp/Amox x 7 days ?- FLM: S/p BMZ x 2 doses (last on 5/15) ?- FWB: Reactive NST x 2, BPP 8/8 today on [facility] verbal report ?- S/p NICU consult ?- Awaiting final [place] - BPP and dopplers ?- Inpatient management until delivery ? ?Fetal growth restriction in both twins ?- See last growth [place](imaging tab) ?- Followed by MFM, original delivery planned on 5/26 at 316w5d ?History of CS x 1 (desires repeat) ?- Consents obtained today (see below) ? ?Consents: ?I have explained to the patient that this surgery is performed to deliver their baby or babies through an incision in the abdomen and incision in the uterus.  Prior to surgery, the risks and benefits of the surgery, as well as alternative treatments were discussed.  The risks include, but are not limited to, possible need for cesarean delivery for all future pregnancies, bleeding at the time of surgery that could necessitate a blood transfusion and/or hysterectomy, rupture of the uterus during a future pregnancy that could cause a preterm delivery and/or requiring hysterectomy, infection, damage to surrounding organs and tissues, damage to bladder, damage to ureters, causing kidney damage, and requiring additional procedures, damage to bowels, resulting in further surgery, postoperative pain, short-term and long-term, scarring on the abdominal wall and intra-abdominally, need for further surgery, development of an incisional hernia, deep vein thrombosis and/or pulmonary embolism, wound infection and/or separation, painful intercourse, urinary leakage, impact on future pregnancies including but not limited to, abnormal location or attachment of the placenta to the uterus, such as placenta previa or accreta, that may necessitate a blood transfusion and/or hysterectomy, impact on total family size, complications the course of which cannot be predicted or prevented, and death. ?Patient was consented for blood products.  The patient is  aware that bleeding may result in the need for a blood transfusion which includes risk of transmission of HIV (1:2 million), Hepatitis C (1:2 million), and Hepatitis B (1:200 thousand) and transfusion reaction.  Patient voiced understanding of the above risks as well as understanding of indications for blood transfusion. ? ? ? ? ?MeDrema DallasDO ? ? ?

## 2021-09-14 ENCOUNTER — Encounter (HOSPITAL_COMMUNITY): Payer: Self-pay | Admitting: Obstetrics and Gynecology

## 2021-09-14 MED ORDER — ONDANSETRON 4 MG PO TBDP
4.0000 mg | ORAL_TABLET | Freq: Three times a day (TID) | ORAL | Status: DC | PRN
  Filled 2021-09-14: qty 1

## 2021-09-14 MED ORDER — FAMOTIDINE 20 MG PO TABS
20.0000 mg | ORAL_TABLET | Freq: Two times a day (BID) | ORAL | Status: DC
  Administered 2021-09-14 – 2021-09-22 (×17): 20 mg via ORAL
  Filled 2021-09-14 (×17): qty 1

## 2021-09-14 MED ORDER — POLYETHYLENE GLYCOL 3350 17 G PO PACK
17.0000 g | PACK | Freq: Every day | ORAL | Status: DC
  Administered 2021-09-14 – 2021-09-19 (×6): 17 g via ORAL
  Filled 2021-09-14 (×8): qty 1

## 2021-09-14 NOTE — Progress Notes (Addendum)
Antepartum Progress Note ? ?Pregnancy complications:  ?Dichorionic/Diamniotic twin gestation ?FGR of both twins ?History of CS x 1 (desires repeat) ?History of PTD at 35 weeks (PPROM, breech presentation) ?AMA (36) ?Obesity (BMI 34) ?Asthma (Trelegy PRN) ?Pre-diabetes ? ?PPROM Date: 09/12/2021 ? ?Subjective: Patient doing well. Reports some nausea now. Has not had a bowel movement since Sunday. Otherwise, fluid leakage is clear. Endorses good FM. Denies VB, LOF, or regular CTX. ? ?Denies fevers, chills, chest pain, visual changes, SOB, RUQ/epigastric pain, N/V, dysuria, hematuria, or sudden onset/worsening bilateral LE or facial edema. ? ?Objective: ?BP 111/64 (BP Location: Left Arm)   Pulse (!) 59   Temp 97.8 ?F (36.6 ?C) (Oral)   Resp 16   Ht '5\' 2"'$  (1.575 m)   Wt 85 kg   LMP 01/25/2021   SpO2 99%   BMI 34.28 kg/m?  ?Gen:  NAD, pleasant and cooperative ?Cardio:  RRR ?Pulm:  CTAB, no wheezes/rales/rhonchi ?Abd:  Soft, gravid, non-distended, non-tender throughout, no rebound/guarding, no fundal tenderness ?Ext:  No bilateral LE edema, no bilateral calf tenderness ? ?FHT A: 125bpm, moderate variability, + accel, - decel ?FHT B: 125bpm, moderate variability, + accel, - decel ?Toco: None ? ?Results for orders placed or performed during the hospital encounter of 09/12/21  ?Culture, OB Urine  ? Specimen: OB Clean Catch; Urine  ?Result Value Ref Range  ? Specimen Description OB CLEAN CATCH   ? Special Requests NONE   ? Culture (A)   ?  MULTIPLE SPECIES PRESENT, SUGGEST RECOLLECTION ?NO GROUP B STREP (S.AGALACTIAE) ISOLATED ?Performed at [facility], [place] [address], [place], [place] [address] ?  ? Report Status 09/13/2021 FINAL   ?Group B strep by PCR  ? Specimen: Vaginal/Rectal; Genital  ?Result Value Ref Range  ? Group B strep by PCR NEGATIVE NEGATIVE  ?Wet prep, genital  ?Result Value Ref Range  ? Yeast Wet Prep HPF POC NONE SEEN NONE SEEN  ? Trich, Wet Prep NONE SEEN NONE SEEN  ? Clue Cells Wet Prep HPF  POC NONE SEEN NONE SEEN  ? WBC, Wet Prep HPF POC <10 <10  ? Sperm NONE SEEN   ?Amnisure rupture of membrane (rom)not at [facility]  ?Result Value Ref Range  ? Amnisure ROM POSITIVE   ?Urinalysis, Routine w reflex microscopic Urine, Clean Catch  ?Result Value Ref Range  ? Color, Urine YELLOW YELLOW  ? APPearance HAZY (A) CLEAR  ? Specific Gravity, Urine 1.016 1.005 - 1.030  ? pH 5.0 5.0 - 8.0  ? Glucose, UA NEGATIVE NEGATIVE mg/dL  ? Hgb urine dipstick NEGATIVE NEGATIVE  ? Bilirubin Urine NEGATIVE NEGATIVE  ? Ketones, ur NEGATIVE NEGATIVE mg/dL  ? Protein, ur NEGATIVE NEGATIVE mg/dL  ? Nitrite NEGATIVE NEGATIVE  ? Leukocytes,Ua NEGATIVE NEGATIVE  ?CBC with Differential/Platelet  ?Result Value Ref Range  ? WBC 9.2 4.0 - 10.5 K/uL  ? RBC 4.11 3.87 - 5.11 MIL/uL  ? Hemoglobin 12.2 12.0 - 15.0 g/dL  ? HCT 37.7 36.0 - 46.0 %  ? MCV 91.7 80.0 - 100.0 fL  ? MCH 29.7 26.0 - 34.0 pg  ? MCHC 32.4 30.0 - 36.0 g/dL  ? RDW 13.0 11.5 - 15.5 %  ? Platelets 244 150 - 400 K/uL  ? nRBC 0.0 0.0 - 0.2 %  ? Neutrophils Relative % 46 %  ? Neutro Abs 4.2 1.7 - 7.7 K/uL  ? Lymphocytes Relative 46 %  ? Lymphs Abs 4.2 (H) 0.7 - 4.0 K/uL  ? Monocytes Relative 7 %  ?  Monocytes Absolute 0.7 0.1 - 1.0 K/uL  ? Eosinophils Relative 1 %  ? Eosinophils Absolute 0.1 0.0 - 0.5 K/uL  ? Basophils Relative 0 %  ? Basophils Absolute 0.0 0.0 - 0.1 K/uL  ? Immature Granulocytes 0 %  ? Abs Immature Granulocytes 0.03 0.00 - 0.07 K/uL  ?Comprehensive metabolic panel  ?Result Value Ref Range  ? Sodium 136 135 - 145 mmol/L  ? Potassium 3.9 3.5 - 5.1 mmol/L  ? Chloride 109 98 - 111 mmol/L  ? CO2 20 (L) 22 - 32 mmol/L  ? Glucose, Bld 88 70 - 99 mg/dL  ? BUN 7 6 - 20 mg/dL  ? Creatinine, Ser 0.82 0.44 - 1.00 mg/dL  ? Calcium 9.5 8.9 - 10.3 mg/dL  ? Total Protein 6.7 6.5 - 8.1 g/dL  ? Albumin 2.8 (L) 3.5 - 5.0 g/dL  ? AST 27 15 - 41 U/L  ? ALT 20 0 - 44 U/L  ? Alkaline Phosphatase 148 (H) 38 - 126 U/L  ? Total Bilirubin 0.4 0.3 - 1.2 mg/dL  ? GFR, Estimated >60 >60 mL/min   ? Anion gap 7 5 - 15  ?RPR  ?Result Value Ref Range  ? RPR Ser Ql NON REACTIVE NON REACTIVE  ?Type and screen [place]  ?Result Value Ref Range  ? ABO/RH(D) B POS   ? Antibody Screen NEG   ? Sample Expiration    ?  09/15/2021,2359 ?Performed at [facility], [place] [address], [place], [place] [address] ?  ?GC/Chlamydia probe amp ([place])not at [facility]  ?Result Value Ref Range  ? Chlamydia Negative   ? Neisseria Gonorrhea Negative   ? Comment Normal Reference [place] Chlamydia - Negative   ? Comment    ?  Normal Reference Range Neisseria Gonorrhea - Negative  ? ? ? ?A/P: ?Lauren Garner is a 36 y.o. G2P0101 @ 71w1dadmitted for PPROM of Twin A in Di/Di Twin Gestation. ? ?PPROM ?- Doing well ?- Latency Abx: Azithromycin, Amp/Amox x 7 days ?- FLM: S/p BMZ x 2 doses (last on 5/15) ?- FWB: Reactive NST x 2, BPP 8/8 on 5/15 ?- S/p NICU consult ?- Awaiting final [place] - BPP and dopplers ?- Inpatient management until delivery at 34 weeks, per MFM ?     - Repeat CS scheduled for 09/20/21 at 1215 ?     - Delivery prior to 34 weeks would be indicated for non-reassuring fetal status, spontaneous labor, or sing of intrauterine infection ? ?Fetal growth restriction in both twins ?- See last growth [place](imaging tab) ?- Followed by MFM - see consult note on 5/15 by Dr. FAnnamaria Boots? ?History of CS x 1 (desires repeat) ?- Consents obtained on 5/15 - signed and witnessed for procedure and blood ? ?Nausea ?- Zofran '4mg'$  ODT q8h PRN ordered ? ?Constipation ?- Miralax PRN and Colace '100mg'$  BID ordered ? ?MDrema Dallas DO ? ? ?

## 2021-09-15 ENCOUNTER — Ambulatory Visit: Payer: BC Managed Care – PPO

## 2021-09-15 MED ORDER — SODIUM CHLORIDE 0.9% FLUSH
3.0000 mL | Freq: Two times a day (BID) | INTRAVENOUS | Status: DC
  Administered 2021-09-15 – 2021-09-22 (×13): 3 mL via INTRAVENOUS

## 2021-09-15 NOTE — Progress Notes (Signed)
Antepartum Progress Note ? ?Pregnancy complications:  ?Dichorionic/Diamniotic twin gestation ?FGR of both twins ?History of CS x 1 (desires repeat) ?History of PTD at 35 weeks (PPROM, breech presentation) ?AMA (36) ?Obesity (BMI 34) ?Asthma (Trelegy PRN) ?Pre-diabetes ? ?PPROM Date: 09/12/2021 ? ?Subjective: Patient doing well. Husband at bedside today. Reports low back pain at around 0300. Had a small bowel movement last night but has been unable to have a good bowel movement. Taking Miralax daily. Reports pink-tinged fluid with urination. Endorses good FM. Denies VB, LOF, or regular CTX. ? ?Denies fevers, chills, chest pain, visual changes, SOB, RUQ/epigastric pain, N/V, dysuria, hematuria, or sudden onset/worsening bilateral LE or facial edema. ? ?Objective: ?BP 121/69 (BP Location: Right Arm)   Pulse 60   Temp 98.3 ?F (36.8 ?C) (Oral)   Resp 18   Ht '5\' 2"'$  (1.575 m)   Wt 85 kg   LMP 01/25/2021   SpO2 100%   BMI 34.28 kg/m?  ?Gen:  NAD, pleasant and cooperative ?Cardio:  RRR ?Pulm:  CTAB, no wheezes/rales/rhonchi ?Abd:  Soft, gravid, non-distended, non-tender throughout, no rebound/guarding, no fundal tenderness ?Ext:  No bilateral LE edema, no bilateral calf tenderness ? ?FHT A: 125bpm, moderate variability, + accel, - decel ?FHT B: 125bpm, moderate variability, + accel, - decel ?Toco: occasional, irritability ? ?Results for orders placed or performed during the hospital encounter of 09/12/21  ?Culture, OB Urine  ? Specimen: OB Clean Catch; Urine  ?Result Value Ref Range  ? Specimen Description OB CLEAN CATCH   ? Special Requests NONE   ? Culture (A)   ?  MULTIPLE SPECIES PRESENT, SUGGEST RECOLLECTION ?NO GROUP B STREP (S.AGALACTIAE) ISOLATED ?Performed at [facility], [place] [address] [place], [place] [address] ?  ? Report Status 09/13/2021 FINAL   ?Group B strep by PCR  ? Specimen: Vaginal/Rectal; Genital  ?Result Value Ref Range  ? Group B strep by PCR NEGATIVE NEGATIVE  ?Wet prep, genital   ?Result Value Ref Range  ? Yeast Wet Prep HPF POC NONE SEEN NONE SEEN  ? Trich, Wet Prep NONE SEEN NONE SEEN  ? Clue Cells Wet Prep HPF POC NONE SEEN NONE SEEN  ? WBC, Wet Prep HPF POC <10 <10  ? Sperm NONE SEEN   ?Amnisure rupture of membrane (rom)not at [facility]  ?Result Value Ref Range  ? Amnisure ROM POSITIVE   ?Urinalysis, Routine w reflex microscopic Urine, Clean Catch  ?Result Value Ref Range  ? Color, Urine YELLOW YELLOW  ? APPearance HAZY (A) CLEAR  ? Specific Gravity, Urine 1.016 1.005 - 1.030  ? pH 5.0 5.0 - 8.0  ? Glucose, UA NEGATIVE NEGATIVE mg/dL  ? Hgb urine dipstick NEGATIVE NEGATIVE  ? Bilirubin Urine NEGATIVE NEGATIVE  ? Ketones, ur NEGATIVE NEGATIVE mg/dL  ? Protein, ur NEGATIVE NEGATIVE mg/dL  ? Nitrite NEGATIVE NEGATIVE  ? Leukocytes,Ua NEGATIVE NEGATIVE  ?CBC with Differential/Platelet  ?Result Value Ref Range  ? WBC 9.2 4.0 - 10.5 K/uL  ? RBC 4.11 3.87 - 5.11 MIL/uL  ? Hemoglobin 12.2 12.0 - 15.0 g/dL  ? HCT 37.7 36.0 - 46.0 %  ? MCV 91.7 80.0 - 100.0 fL  ? MCH 29.7 26.0 - 34.0 pg  ? MCHC 32.4 30.0 - 36.0 g/dL  ? RDW 13.0 11.5 - 15.5 %  ? Platelets 244 150 - 400 K/uL  ? nRBC 0.0 0.0 - 0.2 %  ? Neutrophils Relative % 46 %  ? Neutro Abs 4.2 1.7 - 7.7 K/uL  ?  Lymphocytes Relative 46 %  ? Lymphs Abs 4.2 (H) 0.7 - 4.0 K/uL  ? Monocytes Relative 7 %  ? Monocytes Absolute 0.7 0.1 - 1.0 K/uL  ? Eosinophils Relative 1 %  ? Eosinophils Absolute 0.1 0.0 - 0.5 K/uL  ? Basophils Relative 0 %  ? Basophils Absolute 0.0 0.0 - 0.1 K/uL  ? Immature Granulocytes 0 %  ? Abs Immature Granulocytes 0.03 0.00 - 0.07 K/uL  ?Comprehensive metabolic panel  ?Result Value Ref Range  ? Sodium 136 135 - 145 mmol/L  ? Potassium 3.9 3.5 - 5.1 mmol/L  ? Chloride 109 98 - 111 mmol/L  ? CO2 20 (L) 22 - 32 mmol/L  ? Glucose, Bld 88 70 - 99 mg/dL  ? BUN 7 6 - 20 mg/dL  ? Creatinine, Ser 0.82 0.44 - 1.00 mg/dL  ? Calcium 9.5 8.9 - 10.3 mg/dL  ? Total Protein 6.7 6.5 - 8.1 g/dL  ? Albumin 2.8 (L) 3.5 - 5.0 g/dL  ? AST 27 15 - 41 U/L   ? ALT 20 0 - 44 U/L  ? Alkaline Phosphatase 148 (H) 38 - 126 U/L  ? Total Bilirubin 0.4 0.3 - 1.2 mg/dL  ? GFR, Estimated >60 >60 mL/min  ? Anion gap 7 5 - 15  ?RPR  ?Result Value Ref Range  ? RPR Ser Ql NON REACTIVE NON REACTIVE  ?Type and screen [place]  ?Result Value Ref Range  ? ABO/RH(D) B POS   ? Antibody Screen NEG   ? Sample Expiration    ?  09/15/2021,2359 ?Performed at [facility], [place] [address] [place], [place] [address] ?  ?GC/Chlamydia probe amp ([place])not at [facility]  ?Result Value Ref Range  ? Chlamydia Negative   ? Neisseria Gonorrhea Negative   ? Comment Normal Reference Ranger Chlamydia - Negative   ? Comment    ?  Normal Reference Range Neisseria Gonorrhea - Negative  ? ? ? ?A/P: ?Lauren Garner is a 36 y.o. G2P0101 @ 59w1dadmitted for PPROM of Twin A in Di/Di Twin Gestation. ? ?PPROM ?- Doing well ?- Latency Abx: Azithromycin, Amp/Amox x 7 days ?- FLM: S/p BMZ x 2 doses (last on 5/15) ?- FWB: Reactive NST x 2, BPP 8/8 on 5/15 ?- S/p NICU consult ?- Awaiting final [place] - BPP and dopplers ?- Inpatient management until delivery at 34 weeks, per MFM ?     - Repeat CS scheduled for 09/20/21 at 1215 ?     - Delivery prior to 34 weeks would be indicated for non-reassuring fetal status, spontaneous labor, or sing of intrauterine infection ? ?Fetal growth restriction in both twins ?- See last growth [place](imaging tab) ?- Followed by MFM - see consult note on 5/15 by Dr. FAnnamaria Boots? ?History of CS x 1 (desires repeat) ?- Consents obtained on 5/15 - signed and witnessed for procedure and blood ? ?MDrema Dallas DO ? ? ?

## 2021-09-16 NOTE — Progress Notes (Signed)
Antepartum Progress Note  Pregnancy complications:  Dichorionic/Diamniotic twin gestation FGR of both twins History of CS x 1 (desires repeat) History of PTD at 35 weeks (PPROM, breech presentation) AMA (36) Obesity (BMI 34) Asthma (Trelegy PRN) Pre-diabetes  PPROM Date: 09/12/2021  Subjective: Patient doing well. Husband at bedside. States she had a bowel movement. Last night had some back pain. Reports leakage of clear fluid. Endorses good FM. Denies VB, LOF, or regular CTX.  Denies fevers, chills, chest pain, visual changes, SOB, RUQ/epigastric pain, N/V, dysuria, hematuria, or sudden onset/worsening bilateral LE or facial edema.  Objective: BP 133/74 (BP Location: Right Arm)   Pulse 65   Temp 98.2 F (36.8 C) (Oral)   Resp 19   Ht '5\' 2"'$  (1.575 m)   Wt 85 kg   LMP 01/25/2021   SpO2 97%   BMI 34.28 kg/m  Gen:  NAD, pleasant and cooperative Cardio:  RRR Pulm:  CTAB, no wheezes/rales/rhonchi Abd:  Soft, gravid, non-distended, non-tender throughout, no rebound/guarding, no fundal tenderness Ext:  No bilateral LE edema, no bilateral calf tenderness  FHT A: 135bpm, moderate variability, + accel, - decel FHT B: 135bpm, moderate variability, + accel, - decel Toco: occasional, irritability  Results for orders placed or performed during the hospital encounter of 09/12/21  Culture, OB Urine   Specimen: OB Clean Catch; Urine  Result Value Ref Range   Specimen Description OB CLEAN CATCH    Special Requests NONE    Culture (A)     MULTIPLE SPECIES PRESENT, SUGGEST RECOLLECTION NO GROUP B STREP (S.AGALACTIAE) ISOLATED Performed at [facility], [address], [place], [place] [address]    Report Status 09/13/2021 FINAL   Group B strep by PCR   Specimen: Vaginal/Rectal; Genital  Result Value Ref Range   Group B strep by PCR NEGATIVE NEGATIVE  Wet prep, genital  Result Value Ref Range   Yeast Wet Prep HPF POC NONE SEEN NONE SEEN   Trich, Wet Prep NONE SEEN NONE SEEN    Clue Cells Wet Prep HPF POC NONE SEEN NONE SEEN   WBC, Wet Prep HPF POC <10 <10   Sperm NONE SEEN   Amnisure rupture of membrane (rom)not at [facility]  Result Value Ref Range   Amnisure ROM POSITIVE   Urinalysis, Routine w reflex microscopic Urine, Clean Catch  Result Value Ref Range   Color, Urine YELLOW YELLOW   APPearance HAZY (A) CLEAR   Specific Gravity, Urine 1.016 1.005 - 1.030   pH 5.0 5.0 - 8.0   Glucose, UA NEGATIVE NEGATIVE mg/dL   Hgb urine dipstick NEGATIVE NEGATIVE   Bilirubin Urine NEGATIVE NEGATIVE   Ketones, ur NEGATIVE NEGATIVE mg/dL   Protein, ur NEGATIVE NEGATIVE mg/dL   Nitrite NEGATIVE NEGATIVE   Leukocytes,Ua NEGATIVE NEGATIVE  CBC with Differential/Platelet  Result Value Ref Range   WBC 9.2 4.0 - 10.5 K/uL   RBC 4.11 3.87 - 5.11 MIL/uL   Hemoglobin 12.2 12.0 - 15.0 g/dL   HCT 37.7 36.0 - 46.0 %   MCV 91.7 80.0 - 100.0 fL   MCH 29.7 26.0 - 34.0 pg   MCHC 32.4 30.0 - 36.0 g/dL   RDW 13.0 11.5 - 15.5 %   Platelets 244 150 - 400 K/uL   nRBC 0.0 0.0 - 0.2 %   Neutrophils Relative % 46 %   Neutro Abs 4.2 1.7 - 7.7 K/uL   Lymphocytes Relative 46 %   Lymphs Abs 4.2 (H) 0.7 - 4.0 K/uL  Monocytes Relative 7 %   Monocytes Absolute 0.7 0.1 - 1.0 K/uL   Eosinophils Relative 1 %   Eosinophils Absolute 0.1 0.0 - 0.5 K/uL   Basophils Relative 0 %   Basophils Absolute 0.0 0.0 - 0.1 K/uL   Immature Granulocytes 0 %   Abs Immature Granulocytes 0.03 0.00 - 0.07 K/uL  Comprehensive metabolic panel  Result Value Ref Range   Sodium 136 135 - 145 mmol/L   Potassium 3.9 3.5 - 5.1 mmol/L   Chloride 109 98 - 111 mmol/L   CO2 20 (L) 22 - 32 mmol/L   Glucose, Bld 88 70 - 99 mg/dL   BUN 7 6 - 20 mg/dL   Creatinine, Ser 0.82 0.44 - 1.00 mg/dL   Calcium 9.5 8.9 - 10.3 mg/dL   Total Protein 6.7 6.5 - 8.1 g/dL   Albumin 2.8 (L) 3.5 - 5.0 g/dL   AST 27 15 - 41 U/L   ALT 20 0 - 44 U/L   Alkaline Phosphatase 148 (H) 38 - 126 U/L   Total Bilirubin 0.4 0.3 - 1.2 mg/dL    GFR, Estimated >60 >60 mL/min   Anion gap 7 5 - 15  RPR  Result Value Ref Range   RPR Ser Ql NON REACTIVE NON REACTIVE  Type and screen [place]  Result Value Ref Range   ABO/RH(D) B POS    Antibody Screen NEG    Sample Expiration      09/15/2021,2359 Performed at [facility], [address] [place], [place] [address]   GC/Chlamydia probe amp ([place])not at [facility]  Result Value Ref Range   Chlamydia Negative    Neisseria Gonorrhea Negative    Comment Normal Reference Ranger Chlamydia - Negative    Comment      Normal Reference Range Neisseria Gonorrhea - Negative     A/P: Lauren Garner is a 36 y.o. G2P0101 @ 99w3dadmitted for PPROM of Twin A in Di/Di Twin Gestation.  PPROM - Doing well - Latency Abx: Azithromycin, Amp/Amox x 7 days - FLM: S/p BMZ x 2 doses (last on 5/15) - FWB: Reactive NST x 2, BPP 8/8 on 5/15 - S/p NICU consult - Awaiting final [facility] - BPP and dopplers - Inpatient management until delivery at 34 weeks, per MFM      - Repeat CS scheduled for 09/20/21 at 1215      - Delivery prior to 34 weeks would be indicated for non-reassuring fetal status, spontaneous labor, or sign of intrauterine infection  Fetal growth restriction in both twins - See last growth [place](imaging tab) - Followed by MFM - see consult note on 5/15 by Dr. FAnnamaria Boots History of CS x 1 (desires repeat) - Consents obtained on 5/15 - signed and witnessed for procedure and blood  MDrema Dallas DO

## 2021-09-17 ENCOUNTER — Other Ambulatory Visit (HOSPITAL_COMMUNITY): Admit: 2021-09-17 | Payer: BC Managed Care – PPO

## 2021-09-17 NOTE — Progress Notes (Signed)
Antepartum Progress Note   Pregnancy complications:  Dichorionic/Diamniotic twin gestation FGR of both twins History of CS x 1 (desires repeat) History of PTD at 35 weeks (PPROM, breech presentation) AMA (36) Obesity (BMI 34) Asthma (Trelegy PRN) Pre-diabetes   PPROM Date: 09/12/2021   Subjective:  Patient denies abdominal pain or regular contractions. +FM continued LOF .Marland Kitchen no vaginal bleeding.    Vitals:   09/17/21 1128 09/17/21 1717  BP: 132/80 126/72  Pulse: 88 90  Resp: 19 17  Temp: 98.1 F (36.7 C) 98.4 F (36.9 C)  SpO2: 99% 99%    General: Alert and Oriented NAD Lungs: No distress  Abdomen: Gravid nontender  Extremities: no edema  FHR Reactive NST x 2 .  2 contractions noted no regular contractions.         Results for orders placed or performed during the hospital encounter of 09/12/21  Culture, OB Urine    Specimen: OB Clean Catch; Urine  Result Value Ref Range    Specimen Description OB CLEAN CATCH      Special Requests NONE      Culture (A)        MULTIPLE SPECIES PRESENT, SUGGEST RECOLLECTION NO GROUP B STREP (S.AGALACTIAE) ISOLATED Performed at [place] [address], [place], [place] [address]      Report Status 09/13/2021 FINAL    Group B strep by PCR    Specimen: Vaginal/Rectal; Genital  Result Value Ref Range    Group B strep by PCR NEGATIVE NEGATIVE  Wet prep, genital  Result Value Ref Range    Yeast Wet Prep HPF POC NONE SEEN NONE SEEN    Trich, Wet Prep NONE SEEN NONE SEEN    Clue Cells Wet Prep HPF POC NONE SEEN NONE SEEN    WBC, Wet Prep HPF POC <10 <10    Sperm NONE SEEN    Amnisure rupture of membrane (rom)not at [facility]  Result Value Ref Range    Amnisure ROM POSITIVE    Urinalysis, Routine w reflex microscopic Urine, Clean Catch  Result Value Ref Range    Color, Urine YELLOW YELLOW    APPearance HAZY (A) CLEAR    Specific Gravity, Urine 1.016 1.005 - 1.030    pH 5.0 5.0 - 8.0    Glucose, UA NEGATIVE NEGATIVE mg/dL     Hgb urine dipstick NEGATIVE NEGATIVE    Bilirubin Urine NEGATIVE NEGATIVE    Ketones, ur NEGATIVE NEGATIVE mg/dL    Protein, ur NEGATIVE NEGATIVE mg/dL    Nitrite NEGATIVE NEGATIVE    Leukocytes,Ua NEGATIVE NEGATIVE  CBC with Differential/Platelet  Result Value Ref Range    WBC 9.2 4.0 - 10.5 K/uL    RBC 4.11 3.87 - 5.11 MIL/uL    Hemoglobin 12.2 12.0 - 15.0 g/dL    HCT 37.7 36.0 - 46.0 %    MCV 91.7 80.0 - 100.0 fL    MCH 29.7 26.0 - 34.0 pg    MCHC 32.4 30.0 - 36.0 g/dL    RDW 13.0 11.5 - 15.5 %    Platelets 244 150 - 400 K/uL    nRBC 0.0 0.0 - 0.2 %    Neutrophils Relative % 46 %    Neutro Abs 4.2 1.7 - 7.7 K/uL    Lymphocytes Relative 46 %    Lymphs Abs 4.2 (H) 0.7 - 4.0 K/uL    Monocytes Relative 7 %    Monocytes Absolute 0.7 0.1 - 1.0 K/uL    Eosinophils Relative 1 %  Eosinophils Absolute 0.1 0.0 - 0.5 K/uL    Basophils Relative 0 %    Basophils Absolute 0.0 0.0 - 0.1 K/uL    Immature Granulocytes 0 %    Abs Immature Granulocytes 0.03 0.00 - 0.07 K/uL  Comprehensive metabolic panel  Result Value Ref Range    Sodium 136 135 - 145 mmol/L    Potassium 3.9 3.5 - 5.1 mmol/L    Chloride 109 98 - 111 mmol/L    CO2 20 (L) 22 - 32 mmol/L    Glucose, Bld 88 70 - 99 mg/dL    BUN 7 6 - 20 mg/dL    Creatinine, Ser 0.82 0.44 - 1.00 mg/dL    Calcium 9.5 8.9 - 10.3 mg/dL    Total Protein 6.7 6.5 - 8.1 g/dL    Albumin 2.8 (L) 3.5 - 5.0 g/dL    AST 27 15 - 41 U/L    ALT 20 0 - 44 U/L    Alkaline Phosphatase 148 (H) 38 - 126 U/L    Total Bilirubin 0.4 0.3 - 1.2 mg/dL    GFR, Estimated >60 >60 mL/min    Anion gap 7 5 - 15  RPR  Result Value Ref Range    RPR Ser Ql NON REACTIVE NON REACTIVE  Type and screen [place]  Result Value Ref Range    ABO/RH(D) B POS      Antibody Screen NEG      Sample Expiration          09/15/2021,2359 Performed at [facility], [address] [place], [place] [address]    GC/Chlamydia probe amp ([place])not at  [facility]  Result Value Ref Range    Chlamydia Negative      Neisseria Gonorrhea Negative      Comment Normal Reference [place] Chlamydia - Negative      Comment          Normal Reference Range Neisseria Gonorrhea - Negative        A/P: Lauren Garner is a 36 y.o. G2P0101 @ 15w4dadmitted for PPROM of Twin A in Di/Di Twin Gestation.   PPROM - Doing well - Latency Abx: Azithromycin, Amp/Amox x 7 days - FLM: S/p BMZ x 2 doses (last on 5/15) - FWB: Reactive NST x 2, BPP 8/8 on 5/15 - S/p NICU consult - Inpatient management until delivery at 34 weeks, per MFM      - Repeat CS scheduled for 09/20/21 at 1215      - Delivery prior to 34 weeks would be indicated for non-reassuring fetal status, spontaneous labor, or sign of intrauterine infection   Fetal growth restriction in both twins - See last growth [place](imaging tab) - Followed by MFM - see consult note on 5/15 by Dr. FAnnamaria Boots  History of CS x 1 (desires repeat) - Consents obtained on 5/15 - signed and witnessed for procedure and blood

## 2021-09-17 NOTE — Anesthesia Preprocedure Evaluation (Addendum)
Anesthesia Evaluation  Patient identified by MRN, date of birth, ID band Patient awake    Reviewed: Allergy & Precautions, Patient's Chart, lab work & pertinent test results  History of Anesthesia Complications Negative for: history of anesthetic complications  Airway Mallampati: II  TM Distance: >3 FB Neck ROM: Full    Dental no notable dental hx. (+) Dental Advisory Given, Teeth Intact   Pulmonary asthma ,    Pulmonary exam normal breath sounds clear to auscultation       Cardiovascular negative cardio ROS Normal cardiovascular exam Rhythm:Regular Rate:Normal     Neuro/Psych negative neurological ROS  negative psych ROS   GI/Hepatic negative GI ROS, Neg liver ROS,   Endo/Other  negative endocrine ROS  Renal/GU negative Renal ROS  negative genitourinary   Musculoskeletal negative musculoskeletal ROS (+)   Abdominal (+) + obese,   Peds  Hematology negative hematology ROS (+)   Anesthesia Other Findings Day of surgery medications reviewed with patient.  Reproductive/Obstetrics (+) Pregnancy (Hx of C/S x1, twin gestation)                           Anesthesia Physical Anesthesia Plan  ASA: 2  Anesthesia Plan: Spinal   Post-op Pain Management: Ofirmev IV (intra-op)*, Toradol IV (intra-op)* and Epidural*   Induction:   PONV Risk Score and Plan: 4 or greater and Treatment may vary due to age or medical condition, Ondansetron, Dexamethasone and Scopolamine patch - Pre-op  Airway Management Planned: Natural Airway  Additional Equipment: None  Intra-op Plan:   Post-operative Plan:   Informed Consent: I have reviewed the patients History and Physical, chart, labs and discussed the procedure including the risks, benefits and alternatives for the proposed anesthesia with the patient or authorized representative who has indicated his/her understanding and acceptance.     Dental  advisory given  Plan Discussed with: CRNA  Anesthesia Plan Comments:       Anesthesia Quick Evaluation

## 2021-09-19 LAB — CBC
HCT: 33.6 % — ABNORMAL LOW (ref 36.0–46.0)
Hemoglobin: 10.9 g/dL — ABNORMAL LOW (ref 12.0–15.0)
MCH: 29.9 pg (ref 26.0–34.0)
MCHC: 32.4 g/dL (ref 30.0–36.0)
MCV: 92.1 fL (ref 80.0–100.0)
Platelets: 201 10*3/uL (ref 150–400)
RBC: 3.65 MIL/uL — ABNORMAL LOW (ref 3.87–5.11)
RDW: 13.5 % (ref 11.5–15.5)
WBC: 11.5 10*3/uL — ABNORMAL HIGH (ref 4.0–10.5)
nRBC: 0.9 % — ABNORMAL HIGH (ref 0.0–0.2)

## 2021-09-19 LAB — TYPE AND SCREEN
ABO/RH(D): B POS
Antibody Screen: NEGATIVE

## 2021-09-19 NOTE — Progress Notes (Signed)
In summary, our NICU currently is at capacity and has the inability to admit both twins for delivery as originally scheduled on 5/22. Spoke with Dr. Sophronia Simas (NICU attending) who states that [facility] will be accepting as their NICU has beds available; however, no rooming in is available. Spoke with patient via telephone and she desires a center where they allow rooming in. [facility]'s NICU allows for rooming in and NICU has available beds.  Given patient's strong desire for delivery here at [facility], I discussed situation with Dr. Johnell Comings (MFM) who states if patient remains stable and fetal testing is reassuring, okay to delay delivery by 1 to 2 days in hopes there will be available beds in our NICU. Spoke with Dr. Sophronia Simas again who states Wednesday may be a more suitable day to allow for potential discharges. Will follow-up with NICU on Tuesday and if there are no anticipated beds available, the patient will need transfer of care. Patient understands that she may still require transfer of care at some point if delivery is indicated or if there are still no NICU beds available on 5/24. Cesarean section was rescheduled for 5/24 at 2pm - patient and primary RN aware. Plan discussed with current on-call attending, Dr. Sanjuana Kava.  Drema Dallas, DO

## 2021-09-20 ENCOUNTER — Inpatient Hospital Stay (HOSPITAL_BASED_OUTPATIENT_CLINIC_OR_DEPARTMENT_OTHER): Payer: BC Managed Care – PPO

## 2021-09-20 DIAGNOSIS — O09523 Supervision of elderly multigravida, third trimester: Secondary | ICD-10-CM

## 2021-09-20 DIAGNOSIS — Z3A34 34 weeks gestation of pregnancy: Secondary | ICD-10-CM

## 2021-09-20 DIAGNOSIS — O42913 Preterm premature rupture of membranes, unspecified as to length of time between rupture and onset of labor, third trimester: Secondary | ICD-10-CM

## 2021-09-20 DIAGNOSIS — O365932 Maternal care for other known or suspected poor fetal growth, third trimester, fetus 2: Secondary | ICD-10-CM

## 2021-09-20 DIAGNOSIS — O30043 Twin pregnancy, dichorionic/diamniotic, third trimester: Secondary | ICD-10-CM

## 2021-09-20 DIAGNOSIS — O09213 Supervision of pregnancy with history of pre-term labor, third trimester: Secondary | ICD-10-CM

## 2021-09-20 DIAGNOSIS — O34219 Maternal care for unspecified type scar from previous cesarean delivery: Secondary | ICD-10-CM

## 2021-09-20 LAB — COMPREHENSIVE METABOLIC PANEL
ALT: 29 U/L (ref 0–44)
AST: 37 U/L (ref 15–41)
Albumin: 2.6 g/dL — ABNORMAL LOW (ref 3.5–5.0)
Alkaline Phosphatase: 135 U/L — ABNORMAL HIGH (ref 38–126)
Anion gap: 7 (ref 5–15)
BUN: 10 mg/dL (ref 6–20)
CO2: 20 mmol/L — ABNORMAL LOW (ref 22–32)
Calcium: 8.8 mg/dL — ABNORMAL LOW (ref 8.9–10.3)
Chloride: 109 mmol/L (ref 98–111)
Creatinine, Ser: 0.86 mg/dL (ref 0.44–1.00)
GFR, Estimated: >60 mL/min (ref >60)
Glucose, Bld: 84 mg/dL (ref 70–99)
Potassium: 3.9 mmol/L (ref 3.5–5.1)
Sodium: 136 mmol/L (ref 135–145)
Total Bilirubin: 0.7 mg/dL (ref 0.3–1.2)
Total Protein: 6 g/dL — ABNORMAL LOW (ref 6.5–8.1)

## 2021-09-20 LAB — CBC
HCT: 35.2 % — ABNORMAL LOW (ref 36.0–46.0)
Hemoglobin: 11.5 g/dL — ABNORMAL LOW (ref 12.0–15.0)
MCH: 30.3 pg (ref 26.0–34.0)
MCHC: 32.7 g/dL (ref 30.0–36.0)
MCV: 92.6 fL (ref 80.0–100.0)
Platelets: 210 10*3/uL (ref 150–400)
RBC: 3.8 MIL/uL — ABNORMAL LOW (ref 3.87–5.11)
RDW: 14.3 % (ref 11.5–15.5)
WBC: 11.6 10*3/uL — ABNORMAL HIGH (ref 4.0–10.5)
nRBC: 0.3 % — ABNORMAL HIGH (ref 0.0–0.2)

## 2021-09-20 LAB — RPR: RPR Ser Ql: NONREACTIVE

## 2021-09-20 LAB — LACTATE DEHYDROGENASE: LDH: 233 U/L — ABNORMAL HIGH (ref 98–192)

## 2021-09-20 NOTE — Progress Notes (Signed)
Call was made to patient to discuss [place] - see Dr. Fritz Pickerel note. Patient reassured by findings. Continue delivery as planned on 5/24.  Drema Dallas, DO

## 2021-09-20 NOTE — Consult Note (Signed)
MFM Note  Lauren Garner was seen due to fetal growth restriction of both fetuses in a dichorionic, diamniotic twin gestation.  She has been hospitalized due to PPROM in twin A.  Her delivery has been delayed as the NICU is currently closed.   A BPP performed today was 6 out of 8 for twin A.  Twin A received a -2 for an absent 2 x 2 centimeter pocket of amniotic fluid.  A BPP performed today was 8 out of 8 for twin B.  There was normal amniotic fluid around twin B.  The patient has had daily reactive NSTs for both twin A and twin B while she has been hospitalized.  The overall EFW for twin A today was 3 pounds, which is less than the 1st percentile for her gestational age.  Twin A has grown about 1 pound over the last 19 days.  The overall EFW for twin B today was 3 pounds 10 ounces, which is at less than the 1st percentile for her gestational age.  Twin B has grown 10 ounces over the last 19 days.  Doppler studies of the umbilical arteries performed due to fetal growth restriction showed continued forward flow in both twin A and twin B.  There were no signs of absent or reversed end-diastolic flow noted today in either fetus.  The patient will be delivered via repeat C-section once the NICU has room, which hopefully will be within the next 2 days.  She should continue daily fetal testing until delivery.

## 2021-09-20 NOTE — Progress Notes (Signed)
Call was made to patient to review labs:  CBC    Component Value Date/Time   WBC 11.6 (H) 09/20/2021 0825   RBC 3.80 (L) 09/20/2021 0825   HGB 11.5 (L) 09/20/2021 0825   HCT 35.2 (L) 09/20/2021 0825   PLT 210 09/20/2021 0825   MCV 92.6 09/20/2021 0825   MCH 30.3 09/20/2021 0825   MCHC 32.7 09/20/2021 0825   RDW 14.3 09/20/2021 0825   LYMPHSABS 4.2 (H) 09/12/2021 0549   MONOABS 0.7 09/12/2021 0549   EOSABS 0.1 09/12/2021 0549   BASOSABS 0.0 09/12/2021 0549      Latest Ref Rng & Units 09/20/2021    8:25 AM 09/12/2021    5:49 AM 08/20/2021   11:09 AM  CMP  Glucose 70 - 99 mg/dL 84   88   81    BUN 6 - 20 mg/dL '10   7   8    '$ Creatinine 0.44 - 1.00 mg/dL 0.86   0.82   0.68    Sodium 135 - 145 mmol/L 136   136   136    Potassium 3.5 - 5.1 mmol/L 3.9   3.9   3.9    Chloride 98 - 111 mmol/L 109   109   108    CO2 22 - 32 mmol/L '20   20   20    '$ Calcium 8.9 - 10.3 mg/dL 8.8   9.5   8.8    Total Protein 6.5 - 8.1 g/dL 6.0   6.7   6.1    Total Bilirubin 0.3 - 1.2 mg/dL 0.7   0.4   0.4    Alkaline Phos 38 - 126 U/L 135   148   99    AST 15 - 41 U/L 37   27   25    ALT 0 - 44 U/L '29   20   24     '$ LDH: 233  We discussed overall reassuring. Normal platelets, creatinine, and liver enzymes. All questions invited and answered. Will review [place] with patient once final read is in place.  Drema Dallas, DO

## 2021-09-20 NOTE — Progress Notes (Signed)
Initial Nutrition Assessment  DOCUMENTATION CODES:   Obesity unspecified  INTERVENTION:  Regular Diet Pt may order double protein portions and snacks TID if she makes request when ordering meals   NUTRITION DIAGNOSIS:   Increased nutrient needs related to  (pregnancy and fetal growth requiremants) as evidenced by  (34 week twin pregnancy).  GOAL:   Patient will meet greater than or equal to 90% of their needs  MONITOR:   Weight trends  REASON FOR ASSESSMENT:   Antenatal, LOS    ASSESSMENT:   Now 34 weeks, adm with PROM, and IUGR of twins. pre-preg weight 72.6 kg, BMI 29. weight gain of 27 lbs. Delivery planned for 5/24. On PN Vits and supplemenat iron   Diet Order:   Diet Order             Diet regular Room service appropriate? Yes; Fluid consistency: Thin  Diet effective now                   EDUCATION NEEDS:   No education needs have been identified at this time  Skin:  Skin Assessment: Reviewed RN Assessment    Height:   Ht Readings from Last 1 Encounters:  09/12/21 '5\' 2"'$  (1.575 m)    Weight:   Wt Readings from Last 1 Encounters:  09/12/21 85 kg    Ideal Body Weight:   110 lbs  BMI:  Body mass index is 34.28 kg/m.  Estimated Nutritional Needs:   Kcal:  2400-2600  Protein:  105-115 g  Fluid:  > 2.4 L

## 2021-09-20 NOTE — Progress Notes (Signed)
Antepartum Progress Note  Pregnancy complications:  Dichorionic/Diamniotic twin gestation FGR of both twins History of CS x 1 (desires repeat) History of PTD at 35 weeks (PPROM, breech presentation) AMA (36) Obesity (BMI 34) Asthma (Trelegy PRN) Pre-diabetes  PPROM Date: 09/12/2021  Subjective: Patient doing well. Husband at bedside. No concerns today. She understands new plan of care regarding delivery on 5/24 due to full NICU census. Endorses good FM. Denies VB, LOF, or regular CTX.  Denies fevers, chills, chest pain, visual changes, SOB, RUQ/epigastric pain, N/V, dysuria, hematuria, or sudden onset/worsening bilateral LE or facial edema.  Objective: BP 133/84 (BP Location: Right Arm)   Pulse 89   Temp 98.3 F (36.8 C) (Oral)   Resp 16   Ht '5\' 2"'$  (1.575 m)   Wt 85 kg   LMP 01/25/2021   SpO2 100%   BMI 34.28 kg/m  Gen:  NAD, pleasant and cooperative Cardio:  RRR Pulm:  CTAB, no wheezes/rales/rhonchi Abd:  Soft, gravid, non-distended, non-tender throughout, no rebound/guarding, no fundal tenderness Ext:  Trace bilateral LE edema, no bilateral calf tenderness  FHT A: 145bpm, moderate variability, + accel, - decel FHT B: 145bpm, moderate variability, + accel, - decel Toco: none  Results for orders placed or performed during the hospital encounter of 09/12/21  Culture, OB Urine   Specimen: OB Clean Catch; Urine  Result Value Ref Range   Specimen Description OB CLEAN CATCH    Special Requests NONE    Culture (A)     MULTIPLE SPECIES PRESENT, SUGGEST RECOLLECTION NO GROUP B STREP (S.AGALACTIAE) ISOLATED Performed at [facility], [address], [place], [place] [address]    Report Status 09/13/2021 FINAL   Group B strep by PCR   Specimen: Vaginal/Rectal; Genital  Result Value Ref Range   Group B strep by PCR NEGATIVE NEGATIVE  Wet prep, genital  Result Value Ref Range   Yeast Wet Prep HPF POC NONE SEEN NONE SEEN   Trich, Wet Prep NONE SEEN NONE SEEN   Clue  Cells Wet Prep HPF POC NONE SEEN NONE SEEN   WBC, Wet Prep HPF POC <10 <10   Sperm NONE SEEN   Amnisure rupture of membrane (rom)not at [facility]  Result Value Ref Range   Amnisure ROM POSITIVE   Urinalysis, Routine w reflex microscopic Urine, Clean Catch  Result Value Ref Range   Color, Urine YELLOW YELLOW   APPearance HAZY (A) CLEAR   Specific Gravity, Urine 1.016 1.005 - 1.030   pH 5.0 5.0 - 8.0   Glucose, UA NEGATIVE NEGATIVE mg/dL   Hgb urine dipstick NEGATIVE NEGATIVE   Bilirubin Urine NEGATIVE NEGATIVE   Ketones, ur NEGATIVE NEGATIVE mg/dL   Protein, ur NEGATIVE NEGATIVE mg/dL   Nitrite NEGATIVE NEGATIVE   Leukocytes,Ua NEGATIVE NEGATIVE  CBC with Differential/Platelet  Result Value Ref Range   WBC 9.2 4.0 - 10.5 K/uL   RBC 4.11 3.87 - 5.11 MIL/uL   Hemoglobin 12.2 12.0 - 15.0 g/dL   HCT 37.7 36.0 - 46.0 %   MCV 91.7 80.0 - 100.0 fL   MCH 29.7 26.0 - 34.0 pg   MCHC 32.4 30.0 - 36.0 g/dL   RDW 13.0 11.5 - 15.5 %   Platelets 244 150 - 400 K/uL   nRBC 0.0 0.0 - 0.2 %   Neutrophils Relative % 46 %   Neutro Abs 4.2 1.7 - 7.7 K/uL   Lymphocytes Relative 46 %   Lymphs Abs 4.2 (H) 0.7 - 4.0 K/uL  Monocytes Relative 7 %   Monocytes Absolute 0.7 0.1 - 1.0 K/uL   Eosinophils Relative 1 %   Eosinophils Absolute 0.1 0.0 - 0.5 K/uL   Basophils Relative 0 %   Basophils Absolute 0.0 0.0 - 0.1 K/uL   Immature Granulocytes 0 %   Abs Immature Granulocytes 0.03 0.00 - 0.07 K/uL  Comprehensive metabolic panel  Result Value Ref Range   Sodium 136 135 - 145 mmol/L   Potassium 3.9 3.5 - 5.1 mmol/L   Chloride 109 98 - 111 mmol/L   CO2 20 (L) 22 - 32 mmol/L   Glucose, Bld 88 70 - 99 mg/dL   BUN 7 6 - 20 mg/dL   Creatinine, Ser 0.82 0.44 - 1.00 mg/dL   Calcium 9.5 8.9 - 10.3 mg/dL   Total Protein 6.7 6.5 - 8.1 g/dL   Albumin 2.8 (L) 3.5 - 5.0 g/dL   AST 27 15 - 41 U/L   ALT 20 0 - 44 U/L   Alkaline Phosphatase 148 (H) 38 - 126 U/L   Total Bilirubin 0.4 0.3 - 1.2 mg/dL   GFR,  Estimated >60 >60 mL/min   Anion gap 7 5 - 15  RPR  Result Value Ref Range   RPR Ser Ql NON REACTIVE NON REACTIVE  CBC  Result Value Ref Range   WBC 11.5 (H) 4.0 - 10.5 K/uL   RBC 3.65 (L) 3.87 - 5.11 MIL/uL   Hemoglobin 10.9 (L) 12.0 - 15.0 g/dL   HCT 33.6 (L) 36.0 - 46.0 %   MCV 92.1 80.0 - 100.0 fL   MCH 29.9 26.0 - 34.0 pg   MCHC 32.4 30.0 - 36.0 g/dL   RDW 13.5 11.5 - 15.5 %   Platelets 201 150 - 400 K/uL   nRBC 0.9 (H) 0.0 - 0.2 %  OB RESULTS CONSOLE RPR  Result Value Ref Range   RPR Nonreactive   OB RESULTS CONSOLE HIV antibody  Result Value Ref Range   HIV Non-reactive   OB RESULTS CONSOLE Rubella Antibody  Result Value Ref Range   Rubella Immune   OB RESULTS CONSOLE Hepatitis B surface antigen  Result Value Ref Range   Hepatitis B Surface Ag Negative   Type and screen [place]  Result Value Ref Range   ABO/RH(D) B POS    Antibody Screen NEG    Sample Expiration      09/15/2021,2359 Performed at [facility], [address] [place], [place] [address]   Type and screen [place]  Result Value Ref Range   ABO/RH(D) B POS    Antibody Screen NEG    Sample Expiration      09/22/2021,2359 Performed at [facility], [place] [address] [place], [place] [address]   GC/Chlamydia probe amp ([place])not at [facility]  Result Value Ref Range   Chlamydia Negative    Neisseria Gonorrhea Negative    Comment Normal Reference [place] Chlamydia - Negative    Comment      Normal Reference Range Neisseria Gonorrhea - Negative     A/P: Lauren Garner is a 36 y.o. G2P0101 @ 13w0dadmitted for PPROM of Twin A in Di/Di Twin Gestation.  PPROM - Doing well - Latency Abx: Azithromycin, Amp/Amox x 7 days - FLM: S/p BMZ x 2 doses (last on 5/15) - FWB: Reactive NST x 2, BPP 8/8 on 5/15 - S/p NICU consult - Inpatient management until delivery at 34 weeks, per MFM      -  Repeat CS scheduled for 09/22/2021 at 2pm      -  Delivery prior to 34 weeks would be indicated for non-reassuring fetal status, spontaneous labor, or sign of intrauterine infection  Fetal growth restriction in both twins - See last growth [place] (imaging tab) - Followed by MFM - see consult note on 5/15 by Dr. Annamaria Boots  History of CS x 1 (desires repeat) - Consents obtained on 5/15 - signed and witnessed for procedure and blood  Elevated Bps x 2 - Two mild range Bps during the course of this admission - Suspect spurious Bps vs gestational HTN vs preeclampsia  - Patient is asymptomatic with majority normal blood pressures - Preeclampsia labs ordered, will defer PCR until 5/24 when foley catheter is placed unless needed sooner   Drema [place], DO

## 2021-09-21 ENCOUNTER — Encounter (HOSPITAL_COMMUNITY): Payer: Self-pay | Admitting: Obstetrics and Gynecology

## 2021-09-21 ENCOUNTER — Ambulatory Visit: Payer: BC Managed Care – PPO

## 2021-09-21 MED ORDER — CEFAZOLIN SODIUM-DEXTROSE 2-4 GM/100ML-% IV SOLN
2.0000 g | INTRAVENOUS | Status: AC
  Administered 2021-09-22: 2 g via INTRAVENOUS
  Filled 2021-09-21: qty 100

## 2021-09-21 MED ORDER — LACTATED RINGERS IV SOLN: INTRAVENOUS | Status: DC

## 2021-09-21 MED ORDER — SOD CITRATE-CITRIC ACID 500-334 MG/5ML PO SOLN: 30.0000 mL | ORAL | Status: AC

## 2021-09-21 NOTE — Lactation Note (Signed)
  NICU Lactation Consultation Note  Patient Name: Lauren Garner [id number] Date: 09/21/2021 Age:36 y.o.   Subjective Reason for consult: Mother's request; Initial assessment; Late-preterm 34-36.6wks; Multiple gestation; NICU baby  Lactation conducted initial consult with Ms. Lauren Garner upon maternal request. Ms. Lauren Garner states that she has a scheduled C/S for her twins for 5/24. She wanted to speak with lactation about her feeding plan and had questions about breastfeeding twins prior to delivery.  I provided education regarding what to expect in terms of feedings in the NICU, establishing breast milk, pumping, etc. I answered questions about optimal breastfeeding pillows, donor breast milk (the process for screening donors and the milk), and pumping and returning to work. 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 She has a Spectra pump, but she is also interested in a wearable pump for return to work. I answered questions about different brands of pumps,and I provided some education on the risks/benefits of wearable pumps.I recommended that she prioritize her plug-in pump and utilize the hospital's Symphony pump.  Ms. Lauren Garner would like to sign the [facility] consent form today. I called NICU charge and relayed that request.  Ms. Lauren Garner also asked about using pacifiers on the NICU. She did not use a pacifier with her first child. I educated on how pacifiers are used as a comfort measure on the NICU. She verbalized understanding.  I recommended to Ms. Lauren Garner that we initiate breast pumping within 6 hours (or less) of delivery if she is medically stable.Ms. Lauren Garner endorses breast changes.  Objective  Pump: Personal ([employer]))  Intervention/Plan Interventions: Breast feeding basics reviewed; Education; [employer] brochure  Plan: Consult Status: Follow-up   Lauren Garner 09/21/2021, 12:17 PM

## 2021-09-21 NOTE — Progress Notes (Signed)
Antepartum Progress Note  Pregnancy complications:  Dichorionic/Diamniotic twin gestation FGR of both twins History of CS x 1 (desires repeat) History of PTD at 35 weeks (PPROM, breech presentation) AMA (36) Obesity (BMI 34) Asthma (Trelegy PRN) Pre-diabetes  PPROM Date: 09/12/2021  Subjective: Patient doing well. No concerns today. Endorses good FM. Denies VB, LOF, or regular CTX.  Denies fevers, chills, chest pain, visual changes, SOB, RUQ/epigastric pain, N/V, dysuria, hematuria, or sudden onset/worsening bilateral LE or facial edema.  Objective: BP 138/79 (BP Location: Right Arm)   Pulse 87   Temp 98 F (36.7 C) (Oral)   Resp 16   Ht '5\' 2"'$  (1.575 m)   Wt 85 kg   LMP 01/25/2021   SpO2 99%   BMI 34.28 kg/m  Gen:  NAD, pleasant and cooperative Cardio:  RRR Pulm:  CTAB, no wheezes/rales/rhonchi Abd:  Soft, gravid, non-distended, non-tender throughout, no rebound/guarding, no fundal tenderness Ext:  Trace bilateral LE edema, no bilateral calf tenderness  FHT A: 145bpm, moderate variability, + accel, - decel FHT B: 150bpm, moderate variability, + accel, - decel Toco: none  Results for orders placed or performed during the hospital encounter of 09/12/21  Culture, OB Urine   Specimen: OB Clean Catch; Urine  Result Value Ref Range   Specimen Description OB CLEAN CATCH    Special Requests NONE    Culture (A)     MULTIPLE SPECIES PRESENT, SUGGEST RECOLLECTION NO GROUP B STREP (S.AGALACTIAE) ISOLATED Performed at [facility], [address] [place], [place] [address]    Report Status 09/13/2021 FINAL   Group B strep by PCR   Specimen: Vaginal/Rectal; Genital  Result Value Ref Range   Group B strep by PCR NEGATIVE NEGATIVE  Wet prep, genital  Result Value Ref Range   Yeast Wet Prep HPF POC NONE SEEN NONE SEEN   Trich, Wet Prep NONE SEEN NONE SEEN   Clue Cells Wet Prep HPF POC NONE SEEN NONE SEEN   WBC, Wet Prep HPF POC <10 <10   Sperm NONE SEEN   Amnisure  rupture of membrane (rom)not at [facility]  Result Value Ref Range   Amnisure ROM POSITIVE   Urinalysis, Routine w reflex microscopic Urine, Clean Catch  Result Value Ref Range   Color, Urine YELLOW YELLOW   APPearance HAZY (A) CLEAR   Specific Gravity, Urine 1.016 1.005 - 1.030   pH 5.0 5.0 - 8.0   Glucose, UA NEGATIVE NEGATIVE mg/dL   Hgb urine dipstick NEGATIVE NEGATIVE   Bilirubin Urine NEGATIVE NEGATIVE   Ketones, ur NEGATIVE NEGATIVE mg/dL   Protein, ur NEGATIVE NEGATIVE mg/dL   Nitrite NEGATIVE NEGATIVE   Leukocytes,Ua NEGATIVE NEGATIVE  CBC with Differential/Platelet  Result Value Ref Range   WBC 9.2 4.0 - 10.5 K/uL   RBC 4.11 3.87 - 5.11 MIL/uL   Hemoglobin 12.2 12.0 - 15.0 g/dL   HCT 37.7 36.0 - 46.0 %   MCV 91.7 80.0 - 100.0 fL   MCH 29.7 26.0 - 34.0 pg   MCHC 32.4 30.0 - 36.0 g/dL   RDW 13.0 11.5 - 15.5 %   Platelets 244 150 - 400 K/uL   nRBC 0.0 0.0 - 0.2 %   Neutrophils Relative % 46 %   Neutro Abs 4.2 1.7 - 7.7 K/uL   Lymphocytes Relative 46 %   Lymphs Abs 4.2 (H) 0.7 - 4.0 K/uL   Monocytes Relative 7 %   Monocytes Absolute 0.7 0.1 - 1.0 K/uL   Eosinophils Relative 1 %  Eosinophils Absolute 0.1 0.0 - 0.5 K/uL   Basophils Relative 0 %   Basophils Absolute 0.0 0.0 - 0.1 K/uL   Immature Granulocytes 0 %   Abs Immature Granulocytes 0.03 0.00 - 0.07 K/uL  Comprehensive metabolic panel  Result Value Ref Range   Sodium 136 135 - 145 mmol/L   Potassium 3.9 3.5 - 5.1 mmol/L   Chloride 109 98 - 111 mmol/L   CO2 20 (L) 22 - 32 mmol/L   Glucose, Bld 88 70 - 99 mg/dL   BUN 7 6 - 20 mg/dL   Creatinine, Ser 0.82 0.44 - 1.00 mg/dL   Calcium 9.5 8.9 - 10.3 mg/dL   Total Protein 6.7 6.5 - 8.1 g/dL   Albumin 2.8 (L) 3.5 - 5.0 g/dL   AST 27 15 - 41 U/L   ALT 20 0 - 44 U/L   Alkaline Phosphatase 148 (H) 38 - 126 U/L   Total Bilirubin 0.4 0.3 - 1.2 mg/dL   GFR, Estimated >60 >60 mL/min   Anion gap 7 5 - 15  RPR  Result Value Ref Range   RPR Ser Ql NON REACTIVE NON  REACTIVE  CBC  Result Value Ref Range   WBC 11.5 (H) 4.0 - 10.5 K/uL   RBC 3.65 (L) 3.87 - 5.11 MIL/uL   Hemoglobin 10.9 (L) 12.0 - 15.0 g/dL   HCT 33.6 (L) 36.0 - 46.0 %   MCV 92.1 80.0 - 100.0 fL   MCH 29.9 26.0 - 34.0 pg   MCHC 32.4 30.0 - 36.0 g/dL   RDW 13.5 11.5 - 15.5 %   Platelets 201 150 - 400 K/uL   nRBC 0.9 (H) 0.0 - 0.2 %  OB RESULTS CONSOLE RPR  Result Value Ref Range   RPR Nonreactive   OB RESULTS CONSOLE HIV antibody  Result Value Ref Range   HIV Non-reactive   OB RESULTS CONSOLE Rubella Antibody  Result Value Ref Range   Rubella Immune   OB RESULTS CONSOLE Hepatitis B surface antigen  Result Value Ref Range   Hepatitis B Surface Ag Negative   RPR  Result Value Ref Range   RPR Ser Ql NON REACTIVE NON REACTIVE  CBC  Result Value Ref Range   WBC 11.6 (H) 4.0 - 10.5 K/uL   RBC 3.80 (L) 3.87 - 5.11 MIL/uL   Hemoglobin 11.5 (L) 12.0 - 15.0 g/dL   HCT 35.2 (L) 36.0 - 46.0 %   MCV 92.6 80.0 - 100.0 fL   MCH 30.3 26.0 - 34.0 pg   MCHC 32.7 30.0 - 36.0 g/dL   RDW 14.3 11.5 - 15.5 %   Platelets 210 150 - 400 K/uL   nRBC 0.3 (H) 0.0 - 0.2 %  Comprehensive metabolic panel  Result Value Ref Range   Sodium 136 135 - 145 mmol/L   Potassium 3.9 3.5 - 5.1 mmol/L   Chloride 109 98 - 111 mmol/L   CO2 20 (L) 22 - 32 mmol/L   Glucose, Bld 84 70 - 99 mg/dL   BUN 10 6 - 20 mg/dL   Creatinine, Ser 0.86 0.44 - 1.00 mg/dL   Calcium 8.8 (L) 8.9 - 10.3 mg/dL   Total Protein 6.0 (L) 6.5 - 8.1 g/dL   Albumin 2.6 (L) 3.5 - 5.0 g/dL   AST 37 15 - 41 U/L   ALT 29 0 - 44 U/L   Alkaline Phosphatase 135 (H) 38 - 126 U/L   Total Bilirubin 0.7 0.3 - 1.2 mg/dL  GFR, Estimated >60 >60 mL/min   Anion gap 7 5 - 15  Lactate dehydrogenase  Result Value Ref Range   LDH 233 (H) 98 - 192 U/L  Type and screen [place]  Result Value Ref Range   ABO/RH(D) B POS    Antibody Screen NEG    Sample Expiration      09/15/2021,2359 Performed at [facility] [address], [place], [place] [address]   Type and screen [place]  Result Value Ref Range   ABO/RH(D) B POS    Antibody Screen NEG    Sample Expiration      09/22/2021,2359 Performed at [facility], [place] [address], [place], [place] [address]   GC/Chlamydia probe amp ([place])not at [facility]  Result Value Ref Range   Chlamydia Negative    Neisseria Gonorrhea Negative    Comment Normal Reference [place] Chlamydia - Negative    Comment      Normal Reference Range Neisseria Gonorrhea - Negative     A/P: Lauren Garner is a 36 y.o. G2P0101 @ [redacted]w[redacted]d admitted for PPROM of Twin A in Di/Di Twin Gestation.  PPROM - Doing well - Latency Abx: S/p Azithromycin, Amp/Amox x 7 days - FLM: S/p BMZ complete on 5/16 - FWB: Reactive NST x 2, BPP 8/8 on 5/15 - S/p NICU consult - Inpatient management until delivery at 34 weeks, per MFM      - Repeat CS scheduled for 09/22/2021 at 2pm      - Delivery prior to 34 weeks would be indicated for non-reassuring fetal status, spontaneous labor, or sign of intrauterine infection  Fetal growth restriction in both twins - See last growth [place](imaging tab) - Followed by MFM - see consult note on 5/15 by Dr. FAnnamaria Boots History of CS x 1 (desires repeat) - Consents obtained on 5/15 - signed and witnessed for procedure and blood  Gestational HTN - Three mild range Bps this admission - Suspect spurious Bps vs gestational HTN vs preeclampsia  - Patient is asymptomatic with majority normal blood pressures - Preeclampsia labs ordered, will defer PCR until 5/24 when foley catheter is placed unless needed sooner   MDrema Dallas DO

## 2021-09-22 ENCOUNTER — Other Ambulatory Visit (HOSPITAL_COMMUNITY)
Admission: RE | Admit: 2021-09-22 | Discharge: 2021-09-22 | Disposition: A | Discharge status: Home / Self Care | Payer: BC Managed Care – PPO | Source: Ambulatory Visit | Attending: Obstetrics and Gynecology | Admitting: Obstetrics and Gynecology

## 2021-09-22 ENCOUNTER — Encounter (HOSPITAL_COMMUNITY): Payer: Self-pay | Admitting: Obstetrics and Gynecology

## 2021-09-22 ENCOUNTER — Inpatient Hospital Stay (HOSPITAL_COMMUNITY): Payer: BC Managed Care – PPO | Admitting: Anesthesiology

## 2021-09-22 ENCOUNTER — Ambulatory Visit: Payer: BC Managed Care – PPO

## 2021-09-22 ENCOUNTER — Encounter (HOSPITAL_COMMUNITY)
Admission: AD | Disposition: A | Discharge status: Home / Self Care | Payer: Self-pay | Source: Home / Self Care | Attending: Obstetrics and Gynecology

## 2021-09-22 ENCOUNTER — Other Ambulatory Visit: Payer: Self-pay

## 2021-09-22 LAB — CBC
HCT: 34 % — ABNORMAL LOW (ref 36.0–46.0)
Hemoglobin: 10.9 g/dL — ABNORMAL LOW (ref 12.0–15.0)
MCH: 30 pg (ref 26.0–34.0)
MCHC: 32.1 g/dL (ref 30.0–36.0)
MCV: 93.7 fL (ref 80.0–100.0)
Platelets: 197 10*3/uL (ref 150–400)
RBC: 3.63 MIL/uL — ABNORMAL LOW (ref 3.87–5.11)
RDW: 14.7 % (ref 11.5–15.5)
WBC: 11.3 10*3/uL — ABNORMAL HIGH (ref 4.0–10.5)
nRBC: 0 % (ref 0.0–0.2)

## 2021-09-22 LAB — TYPE AND SCREEN
ABO/RH(D): B POS
Antibody Screen: NEGATIVE

## 2021-09-22 SURGERY — Surgical Case
Anesthesia: Spinal | Site: Abdomen | Wound class: Clean Contaminated

## 2021-09-22 MED ORDER — NALOXONE HCL 4 MG/10ML IJ SOLN: 1.0000 ug/kg/h | INTRAVENOUS | Status: DC | PRN

## 2021-09-22 MED ORDER — ZOLPIDEM TARTRATE 5 MG PO TABS: 5.0000 mg | ORAL_TABLET | Freq: Every evening | ORAL | Status: DC | PRN

## 2021-09-22 MED ORDER — NALOXONE HCL 0.4 MG/ML IJ SOLN: 0.4000 mg | INTRAMUSCULAR | Status: DC | PRN

## 2021-09-22 MED ORDER — DEXAMETHASONE SODIUM PHOSPHATE 4 MG/ML IJ SOLN
INTRAMUSCULAR | Status: DC | PRN
  Administered 2021-09-22: 4 mg via INTRAVENOUS

## 2021-09-22 MED ORDER — ALBUTEROL SULFATE (2.5 MG/3ML) 0.083% IN NEBU: 3.0000 mL | INHALATION_SOLUTION | Freq: Four times a day (QID) | RESPIRATORY_TRACT | Status: DC | PRN

## 2021-09-22 MED ORDER — SENNOSIDES-DOCUSATE SODIUM 8.6-50 MG PO TABS
2.0000 | ORAL_TABLET | Freq: Every day | ORAL | Status: DC
  Administered 2021-09-23 – 2021-09-24 (×2): 2 via ORAL
  Filled 2021-09-22 (×2): qty 2

## 2021-09-22 MED ORDER — ACETAMINOPHEN 500 MG PO TABS: 1000.0000 mg | ORAL_TABLET | Freq: Four times a day (QID) | ORAL | Status: DC

## 2021-09-22 MED ORDER — MEPERIDINE HCL 25 MG/ML IJ SOLN: 6.2500 mg | INTRAMUSCULAR | Status: DC | PRN

## 2021-09-22 MED ORDER — LABETALOL HCL 5 MG/ML IV SOLN: 20.0000 mg | INTRAVENOUS | Status: DC | PRN

## 2021-09-22 MED ORDER — DEXAMETHASONE SODIUM PHOSPHATE 4 MG/ML IJ SOLN
INTRAMUSCULAR | Status: AC
  Filled 2021-09-22: qty 1

## 2021-09-22 MED ORDER — PHENYLEPHRINE HCL-NACL 20-0.9 MG/250ML-% IV SOLN
INTRAVENOUS | Status: AC
Start: 2021-09-22 — End: ?
  Filled 2021-09-22: qty 250

## 2021-09-22 MED ORDER — OXYTOCIN-SODIUM CHLORIDE 30-0.9 UT/500ML-% IV SOLN
INTRAVENOUS | Status: AC
Start: 2021-09-22 — End: ?
  Filled 2021-09-22: qty 500

## 2021-09-22 MED ORDER — OXYCODONE HCL 5 MG PO TABS: 5.0000 mg | ORAL_TABLET | ORAL | Status: DC | PRN

## 2021-09-22 MED ORDER — MORPHINE SULFATE (PF) 0.5 MG/ML IJ SOLN
INTRAMUSCULAR | Status: AC
  Filled 2021-09-22: qty 10

## 2021-09-22 MED ORDER — SCOPOLAMINE 1 MG/3DAYS TD PT72: 1.0000 | MEDICATED_PATCH | Freq: Once | TRANSDERMAL | Status: DC

## 2021-09-22 MED ORDER — SOD CITRATE-CITRIC ACID 500-334 MG/5ML PO SOLN
ORAL | Status: AC
Start: 2021-09-22 — End: 2021-09-22
  Administered 2021-09-22: 30 mL via ORAL
  Filled 2021-09-22: qty 30

## 2021-09-22 MED ORDER — SODIUM CHLORIDE 0.9% FLUSH: 3.0000 mL | INTRAVENOUS | Status: DC | PRN

## 2021-09-22 MED ORDER — TRANEXAMIC ACID-NACL 1000-0.7 MG/100ML-% IV SOLN
INTRAVENOUS | Status: AC
  Filled 2021-09-22: qty 100

## 2021-09-22 MED ORDER — HYDRALAZINE HCL 20 MG/ML IJ SOLN: 10.0000 mg | INTRAMUSCULAR | Status: DC | PRN

## 2021-09-22 MED ORDER — KETOROLAC TROMETHAMINE 30 MG/ML IJ SOLN
INTRAMUSCULAR | Status: DC | PRN
  Administered 2021-09-22: 30 mg via INTRAVENOUS

## 2021-09-22 MED ORDER — HYDROCODONE-ACETAMINOPHEN 5-325 MG PO TABS
1.0000 | ORAL_TABLET | Freq: Four times a day (QID) | ORAL | Status: DC | PRN
  Administered 2021-09-22: 1 via ORAL
  Filled 2021-09-22: qty 1

## 2021-09-22 MED ORDER — MORPHINE SULFATE (PF) 2 MG/ML IV SOLN: 1.0000 mg | INTRAVENOUS | Status: DC | PRN

## 2021-09-22 MED ORDER — PRENATAL MULTIVITAMIN CH
1.0000 | ORAL_TABLET | Freq: Every day | ORAL | Status: DC
  Administered 2021-09-23 – 2021-09-24 (×2): 1 via ORAL
  Filled 2021-09-22 (×2): qty 1

## 2021-09-22 MED ORDER — SIMETHICONE 80 MG PO CHEW
80.0000 mg | CHEWABLE_TABLET | Freq: Three times a day (TID) | ORAL | Status: DC
  Administered 2021-09-23 – 2021-09-24 (×5): 80 mg via ORAL
  Filled 2021-09-22 (×5): qty 1

## 2021-09-22 MED ORDER — NALBUPHINE HCL 10 MG/ML IJ SOLN
10.0000 mg | INTRAMUSCULAR | Status: DC | PRN
  Administered 2021-09-22 – 2021-09-23 (×3): 10 mg via INTRAVENOUS
  Filled 2021-09-22 (×3): qty 1

## 2021-09-22 MED ORDER — FENTANYL CITRATE (PF) 100 MCG/2ML IJ SOLN
INTRAMUSCULAR | Status: DC | PRN
  Administered 2021-09-22: 15 ug via INTRATHECAL

## 2021-09-22 MED ORDER — TRANEXAMIC ACID-NACL 1000-0.7 MG/100ML-% IV SOLN
INTRAVENOUS | Status: DC | PRN
  Administered 2021-09-22: 1000 mg via INTRAVENOUS

## 2021-09-22 MED ORDER — PHENYLEPHRINE HCL-NACL 20-0.9 MG/250ML-% IV SOLN
INTRAVENOUS | Status: DC | PRN
  Administered 2021-09-22: 60 ug/min via INTRAVENOUS

## 2021-09-22 MED ORDER — KETOROLAC TROMETHAMINE 30 MG/ML IJ SOLN
30.0000 mg | Freq: Four times a day (QID) | INTRAMUSCULAR | Status: AC
  Administered 2021-09-23 (×2): 30 mg via INTRAVENOUS
  Filled 2021-09-22 (×2): qty 1

## 2021-09-22 MED ORDER — ONDANSETRON HCL 4 MG/2ML IJ SOLN: 4.0000 mg | Freq: Three times a day (TID) | INTRAMUSCULAR | Status: DC | PRN

## 2021-09-22 MED ORDER — LACTATED RINGERS IV SOLN: INTRAVENOUS | Status: DC

## 2021-09-22 MED ORDER — IBUPROFEN 600 MG PO TABS
600.0000 mg | ORAL_TABLET | Freq: Four times a day (QID) | ORAL | Status: DC
  Administered 2021-09-23 – 2021-09-24 (×4): 600 mg via ORAL
  Filled 2021-09-22 (×4): qty 1

## 2021-09-22 MED ORDER — DIPHENHYDRAMINE HCL 25 MG PO CAPS
25.0000 mg | ORAL_CAPSULE | Freq: Four times a day (QID) | ORAL | Status: DC | PRN
Start: 2021-09-22 — End: 2021-09-24
  Administered 2021-09-23: 25 mg via ORAL
  Filled 2021-09-22: qty 1

## 2021-09-22 MED ORDER — LABETALOL HCL 5 MG/ML IV SOLN: 40.0000 mg | INTRAVENOUS | Status: DC | PRN

## 2021-09-22 MED ORDER — ACETAMINOPHEN 10 MG/ML IV SOLN
INTRAVENOUS | Status: DC | PRN
  Administered 2021-09-22: 1000 mg via INTRAVENOUS

## 2021-09-22 MED ORDER — OXYTOCIN-SODIUM CHLORIDE 30-0.9 UT/500ML-% IV SOLN
INTRAVENOUS | Status: DC | PRN
  Administered 2021-09-22: 300 mL via INTRAVENOUS

## 2021-09-22 MED ORDER — FERROUS SULFATE 325 (65 FE) MG PO TABS
325.0000 mg | ORAL_TABLET | Freq: Two times a day (BID) | ORAL | Status: DC
  Administered 2021-09-23 – 2021-09-24 (×3): 325 mg via ORAL
  Filled 2021-09-22 (×3): qty 1

## 2021-09-22 MED ORDER — TRANEXAMIC ACID-NACL 1000-0.7 MG/100ML-% IV SOLN: INTRAVENOUS | Status: DC | PRN

## 2021-09-22 MED ORDER — KETOROLAC TROMETHAMINE 30 MG/ML IJ SOLN
INTRAMUSCULAR | Status: AC
  Filled 2021-09-22: qty 1

## 2021-09-22 MED ORDER — MORPHINE SULFATE (PF) 0.5 MG/ML IJ SOLN
INTRAMUSCULAR | Status: DC | PRN
  Administered 2021-09-22: 150 ug via INTRATHECAL

## 2021-09-22 MED ORDER — KETOROLAC TROMETHAMINE 30 MG/ML IJ SOLN: 30.0000 mg | Freq: Once | INTRAMUSCULAR | Status: DC | PRN

## 2021-09-22 MED ORDER — WITCH HAZEL-GLYCERIN EX PADS: 1.0000 "application " | MEDICATED_PAD | CUTANEOUS | Status: DC | PRN

## 2021-09-22 MED ORDER — ONDANSETRON HCL 4 MG/2ML IJ SOLN
INTRAMUSCULAR | Status: AC
  Filled 2021-09-22: qty 2

## 2021-09-22 MED ORDER — CEFAZOLIN SODIUM-DEXTROSE 2-4 GM/100ML-% IV SOLN
INTRAVENOUS | Status: AC
  Filled 2021-09-22: qty 100

## 2021-09-22 MED ORDER — DIBUCAINE (PERIANAL) 1 % EX OINT: 1.0000 "application " | TOPICAL_OINTMENT | CUTANEOUS | Status: DC | PRN

## 2021-09-22 MED ORDER — FENTANYL CITRATE (PF) 100 MCG/2ML IJ SOLN
INTRAMUSCULAR | Status: AC
  Filled 2021-09-22: qty 2

## 2021-09-22 MED ORDER — OXYTOCIN-SODIUM CHLORIDE 30-0.9 UT/500ML-% IV SOLN: 2.5000 [IU]/h | INTRAVENOUS | Status: AC

## 2021-09-22 MED ORDER — ONDANSETRON HCL 4 MG/2ML IJ SOLN
INTRAMUSCULAR | Status: DC | PRN
  Administered 2021-09-22: 4 mg via INTRAVENOUS

## 2021-09-22 MED ORDER — DIPHENHYDRAMINE HCL 25 MG PO CAPS: 25.0000 mg | ORAL_CAPSULE | ORAL | Status: DC | PRN

## 2021-09-22 MED ORDER — HYDRALAZINE HCL 20 MG/ML IJ SOLN: 5.0000 mg | INTRAMUSCULAR | Status: DC | PRN

## 2021-09-22 MED ORDER — ACETAMINOPHEN 10 MG/ML IV SOLN
INTRAVENOUS | Status: AC
Start: 2021-09-22 — End: ?
  Filled 2021-09-22: qty 100

## 2021-09-22 MED ORDER — SIMETHICONE 80 MG PO CHEW: 80.0000 mg | CHEWABLE_TABLET | ORAL | Status: DC | PRN

## 2021-09-22 MED ORDER — PROMETHAZINE HCL 25 MG/ML IJ SOLN: 6.2500 mg | INTRAMUSCULAR | Status: DC | PRN

## 2021-09-22 MED ORDER — BUPIVACAINE IN DEXTROSE 0.75-8.25 % IT SOLN
INTRATHECAL | Status: DC | PRN
  Administered 2021-09-22: 1.5 mL via INTRATHECAL

## 2021-09-22 MED ORDER — COCONUT OIL OIL
1.0000 | TOPICAL_OIL | Status: DC | PRN
Start: 2021-09-22 — End: 2021-09-24

## 2021-09-22 MED ORDER — DIPHENHYDRAMINE HCL 50 MG/ML IJ SOLN
12.5000 mg | INTRAMUSCULAR | Status: DC | PRN
  Administered 2021-09-22: 12.5 mg via INTRAVENOUS
  Filled 2021-09-22: qty 1

## 2021-09-22 MED ORDER — HYDROMORPHONE HCL 1 MG/ML IJ SOLN: 0.2500 mg | INTRAMUSCULAR | Status: DC | PRN

## 2021-09-22 SURGICAL SUPPLY — 37 items
BARRIER ADHS 3X4 INTERCEED (GAUZE/BANDAGES/DRESSINGS) IMPLANT
BENZOIN TINCTURE PRP APPL 2/3 (GAUZE/BANDAGES/DRESSINGS) ×1 IMPLANT
CHLORAPREP W/TINT 26 (MISCELLANEOUS) ×1 IMPLANT
CLAMP UMBILICAL CORD (MISCELLANEOUS) ×3 IMPLANT
DRSG OPSITE POSTOP 4X10 (GAUZE/BANDAGES/DRESSINGS) ×1 IMPLANT
DRSG PAD ABDOMINAL 8X10 ST (GAUZE/BANDAGES/DRESSINGS) ×1 IMPLANT
ELECT REM PT RETURN 9FT ADLT (ELECTROSURGICAL) ×3
ELECTRODE REM PT RTRN 9FT ADLT (ELECTROSURGICAL) IMPLANT
EXTRACTOR VACUUM KIWI (MISCELLANEOUS) ×1 IMPLANT
GAUZE SPONGE 4X4 12PLY STRL LF (GAUZE/BANDAGES/DRESSINGS) ×1 IMPLANT
GLOVE BIO SURGEON STRL SZ 6.5 (GLOVE) ×1 IMPLANT
GLOVE BIOGEL PI IND STRL 7.0 (GLOVE) IMPLANT
GLOVE BIOGEL PI INDICATOR 7.0 (GLOVE) ×3
GOWN STRL REUS W/ TWL LRG LVL3 (GOWN DISPOSABLE) IMPLANT
GOWN STRL REUS W/TWL LRG LVL3 (GOWN DISPOSABLE) ×1
KIT ABG SYR 3ML LUER SLIP (SYRINGE) ×1 IMPLANT
NDL HYPO 25X5/8 SAFETYGLIDE (NEEDLE) IMPLANT
NEEDLE HYPO 25X5/8 SAFETYGLIDE (NEEDLE) ×3 IMPLANT
NS IRRIG 1000ML POUR BTL (IV SOLUTION) ×1 IMPLANT
PACK C SECTION WH (CUSTOM PROCEDURE TRAY) ×1 IMPLANT
PAD OB MATERNITY 4.3X12.25 (PERSONAL CARE ITEMS) IMPLANT
PENCIL SMOKE EVACUATOR (MISCELLANEOUS) ×1 IMPLANT
RTRCTR C-SECT PINK 25CM LRG (MISCELLANEOUS) ×1 IMPLANT
STRIP CLOSURE SKIN 1/2X4 (GAUZE/BANDAGES/DRESSINGS) ×1 IMPLANT
SUT MNCRL 0 VIOLET CTX 36 (SUTURE) IMPLANT
SUT MONOCRYL 0 CTX 36 (SUTURE) ×4
SUT PDS AB 0 CT1 27 (SUTURE) ×1 IMPLANT
SUT PLAIN 0 NONE (SUTURE) ×1 IMPLANT
SUT VIC AB 0 CTX 36 (SUTURE) ×1
SUT VIC AB 0 CTX36XBRD ANBCTRL (SUTURE) IMPLANT
SUT VIC AB 2-0 CT1 27 (SUTURE) ×1
SUT VIC AB 2-0 CT1 TAPERPNT 27 (SUTURE) IMPLANT
SUT VIC AB 3-0 SH 27 (SUTURE) ×1
SUT VIC AB 3-0 SH 27X BRD (SUTURE) IMPLANT
SUT VIC AB 4-0 KS 27 (SUTURE) ×1 IMPLANT
TRAY FOLEY W/BAG SLVR 14FR (SET/KITS/TRAYS/PACK) ×1 IMPLANT
WATER STERILE IRR 1000ML POUR (IV SOLUTION) ×1 IMPLANT

## 2021-09-22 NOTE — Anesthesia Procedure Notes (Signed)
Spinal  Patient location during procedure: OR Start time: 09/22/2021 2:55 PM End time: 09/22/2021 3:00 PM Reason for block: surgical anesthesia Staffing Performed: anesthesiologist  Anesthesiologist: Nolon Nations, MD Preanesthetic Checklist Completed: patient identified, IV checked, site marked, risks and benefits discussed, surgical consent, monitors and equipment checked, pre-op evaluation and timeout performed Spinal Block Prep: DuraPrep and site prepped and draped Patient monitoring: heart rate, continuous pulse ox and blood pressure Approach: midline Location: L3-4 Injection technique: single-shot Needle Needle type: Spinocan  Needle gauge: 25 G Needle length: 9 cm Additional Notes Expiration date of kit checked and confirmed. Patient tolerated procedure well, without complications.

## 2021-09-22 NOTE — Interval H&P Note (Signed)
History and Physical Interval Note:  09/22/2021 7:47 AM  Lauren Garner  has presented today for surgery, with the diagnosis of PPROM, twin gestation (MFM recommended). The various methods of treatment have been discussed with the patient and family. After consideration of risks, benefits and other options for treatment, the patient has consented to  Procedure(s): CESAREAN SECTION MULTI-GESTATIONAL (N/A) APPLICATION OF CELL SAVER (Bilateral) as a surgical intervention.  The patient's history has been reviewed, patient examined, no change in status, stable for surgery.  I have reviewed the patient's chart and labs.  Questions were answered to the patient's satisfaction.     Drema Dallas

## 2021-09-22 NOTE — Anesthesia Postprocedure Evaluation (Signed)
Anesthesia Post Note  Patient: Lauren Garner  Procedure(s) Performed: CESAREAN SECTION MULTI-GESTATIONAL (Abdomen) APPLICATION OF CELL SAVER (Bilateral: Abdomen)     Patient location during evaluation: PACU Anesthesia Type: Spinal Level of consciousness: awake and alert Pain management: pain level controlled Vital Signs Assessment: post-procedure vital signs reviewed and stable Respiratory status: spontaneous breathing and respiratory function stable Cardiovascular status: blood pressure returned to baseline and stable Postop Assessment: spinal receding Anesthetic complications: no   No notable events documented.  Last Vitals:  Vitals:   09/22/21 1842 09/22/21 1937  BP: (!) 131/92 (!) 117/92  Pulse: 78 (!) 56  Resp: 16 16  Temp: 36.6 C 36.4 C  SpO2: 100% 100%    Last Pain:  Vitals:   09/22/21 2000  TempSrc:   PainSc: [place]

## 2021-09-22 NOTE — Op Note (Signed)
Pre Op Dx:   1. Live Dichorionic Diamniotic Twin Gestation 2. Preterm Premature Rupture of Membranes 3. Fetal growth restriction of both twins <1% 4. History of cesarean section x 1 (desires repeat)  Post Op Dx:  Same as post-operative diagnoses  Procedure:  Low Transverse Cesarean Section  Surgeon:  Dr. Wellington Hampshire. Delora Fuel Assistants:  Dr. Christophe Louis Anesthesia:  Spinal  EBL:  116cc  IVF:  See anesthesia documentation UOP:  200cc  Drains:  Foley catheter  Specimen removed:  Placenta x 2 - sent to pathology Device(s) implanted:  None Case Type:  Clean-contaminated Findings: Normal-appearing uterus with a ~2cm anterior fibroid, normal-appearing bilateral fallopian tubes and ovaries. Very thin lower uterine segment. Both twins in cephalic position. Minimal, clear amniotic fluid. Complications: None Indications:  36 y.o. G2P0101 at 36w2dwith a Di/Di twin gestation, fetal growth restriction of both twins, and PPROM.   Procedure:  After informed consent was obtained, the patient was brought to the operating room.  Following administration of anesthesia, the patient was positioned in dorsal supine position with a leftward tilt and was prepped and draped in sterile fashion.  A preoperative time-out was performed.  TXA 1g was given as prophylaxis prior to start of cesarean section. The abdomen was entered in layers through a pfannenstiel incision and a retractor was placed.  A bladder flap was created sharply. A low transverse hysterotomy was created sharply to the level of the membranes, then extended bluntly. Fetus A was delivered from cephalic presentation onto the field. Bulb suctioning was performed.  The cord was doubly clamped and cut and infant was passed to the awaiting NICU team. Fetus B was delivered from cephalic presentation onto the field. Bulb suctioning performed. The cord was doubly clamped and cut and infant was passed to the awaiting NICU team. The placentas were delivered. The  uterus was swept free of clots and debris and closed in a running locked fashion with 0-Monocryl. A second imbricating layer was used to close the uterus using 0-Monocryl. Additional figure-of-eight sutures using 0-Monocryl were placed along the hysterotomy for additional hemostasis.  Hemostasis was verified.  The abdomen was irrigated with warmed saline and cleared of clots. Subfascial spaces were inspected and hemostasis assured.  The fascia was closed in a running fashion with 0-PDS.  The subcutaneous tissues were irrigated and hemostasis assured.  The subcutaneous tissues were closed with 3-0 Monocryl.  The skin was closed with 4-0 Vicryl.  A sterile bandage was applied.  The patient was transferred to PACU.  All needle, sponge, and instrument counts were correct at the end of the case.   Disposition:  PACU  I performed the procedure and the assistant was needed due to the complexity of the anatomy.  MDrema Dallas DO

## 2021-09-22 NOTE — Lactation Note (Signed)
This note was copied from a baby's chart. Lactation Consultation Note  Patient Name: Lauren Garner [id number] Date: 09/22/2021   Age:36 hours  LC orders received and acknowledged. NICU LC to F/U with mom tomorrow for initial assessment, she's still in PACU; and this is the end of NICU LC shift.   [place] 09/22/2021, 5:16 PM

## 2021-09-22 NOTE — Transfer of Care (Signed)
Immediate Anesthesia Transfer of Care Note  Patient: Lauren Garner  Procedure(s) Performed: CESAREAN SECTION MULTI-GESTATIONAL (Abdomen) APPLICATION OF CELL SAVER (Bilateral: Abdomen)  Patient Location: PACU  Anesthesia Type:Spinal  Level of Consciousness: awake, alert  and oriented  Airway & Oxygen Therapy: Patient Spontanous Breathing  Post-op Assessment: Report given to RN and Post -op Vital signs reviewed and stable  Post vital signs: Reviewed and stable  Last Vitals:  Vitals Value Taken Time  BP 128/83 09/22/21 1615  Temp    Pulse 73 09/22/21 1617  Resp 13 09/22/21 1617  SpO2 100 % 09/22/21 1617  Vitals shown include unvalidated device data.  Last Pain:  Vitals:   09/22/21 1349  TempSrc: [place]  PainSc:       Patients Stated Pain Goal: 3 (37/94/44 6190)  Complications: No notable events documented.

## 2021-09-22 NOTE — Progress Notes (Addendum)
Antepartum Progress Note  Pregnancy complications:  Dichorionic/Diamniotic twin gestation FGR of both twins History of CS x 1 (desires repeat) History of PTD at 35 weeks (PPROM, breech presentation) AMA (36) Obesity (BMI 34) Asthma (Trelegy PRN) Pre-diabetes  PPROM Date: 09/12/2021  Subjective: Patient doing well. No concerns today. She is ready for her CS today. Felt a little anxious this morning - BP was elevated. Husband at bedside. All questions invited and answered. Endorses good FM. Denies VB, LOF, or regular CTX.  Denies fevers, chills, chest pain, visual changes, SOB, RUQ/epigastric pain, N/V, dysuria, hematuria, or sudden onset/worsening bilateral LE or facial edema.  Objective: BP (!) 148/94 (BP Location: Right Arm)   Pulse 90   Temp 97.7 F (36.5 C) (Oral)   Resp 18   Ht '5\' 2"'$  (1.575 m)   Wt 85 kg   LMP 01/25/2021   SpO2 99%   BMI 34.28 kg/m  Gen:  NAD, pleasant and cooperative Cardio:  RRR Pulm:  CTAB, no wheezes/rales/rhonchi Abd:  Soft, gravid, non-distended, non-tender throughout, no rebound/guarding, no fundal tenderness Ext:  Trace bilateral LE edema, no bilateral calf tenderness  FHT A: 145bpm, moderate variability, + accel, - decel FHT B: 150bpm, moderate variability, + accel, - decel Toco: none  Results for orders placed or performed during the hospital encounter of 09/12/21  Culture, OB Urine   Specimen: OB Clean Catch; Urine  Result Value Ref Range   Specimen Description OB CLEAN CATCH    Special Requests NONE    Culture (A)     MULTIPLE SPECIES PRESENT, SUGGEST RECOLLECTION NO GROUP B STREP (S.AGALACTIAE) ISOLATED Performed at [facility], [address], [place], [place] [address]    Report Status 09/13/2021 FINAL   Group B strep by PCR   Specimen: Vaginal/Rectal; Genital  Result Value Ref Range   Group B strep by PCR NEGATIVE NEGATIVE  Wet prep, genital  Result Value Ref Range   Yeast Wet Prep HPF POC NONE SEEN NONE SEEN    Trich, Wet Prep NONE SEEN NONE SEEN   Clue Cells Wet Prep HPF POC NONE SEEN NONE SEEN   WBC, Wet Prep HPF POC <10 <10   Sperm NONE SEEN   Amnisure rupture of membrane (rom)not at [facility]  Result Value Ref Range   Amnisure ROM POSITIVE   Urinalysis, Routine w reflex microscopic Urine, Clean Catch  Result Value Ref Range   Color, Urine YELLOW YELLOW   APPearance HAZY (A) CLEAR   Specific Gravity, Urine 1.016 1.005 - 1.030   pH 5.0 5.0 - 8.0   Glucose, UA NEGATIVE NEGATIVE mg/dL   Hgb urine dipstick NEGATIVE NEGATIVE   Bilirubin Urine NEGATIVE NEGATIVE   Ketones, ur NEGATIVE NEGATIVE mg/dL   Protein, ur NEGATIVE NEGATIVE mg/dL   Nitrite NEGATIVE NEGATIVE   Leukocytes,Ua NEGATIVE NEGATIVE  CBC with Differential/Platelet  Result Value Ref Range   WBC 9.2 4.0 - 10.5 K/uL   RBC 4.11 3.87 - 5.11 MIL/uL   Hemoglobin 12.2 12.0 - 15.0 g/dL   HCT 37.7 36.0 - 46.0 %   MCV 91.7 80.0 - 100.0 fL   MCH 29.7 26.0 - 34.0 pg   MCHC 32.4 30.0 - 36.0 g/dL   RDW 13.0 11.5 - 15.5 %   Platelets 244 150 - 400 K/uL   nRBC 0.0 0.0 - 0.2 %   Neutrophils Relative % 46 %   Neutro Abs 4.2 1.7 - 7.7 K/uL   Lymphocytes Relative 46 %   Lymphs Abs  4.2 (H) 0.7 - 4.0 K/uL   Monocytes Relative 7 %   Monocytes Absolute 0.7 0.1 - 1.0 K/uL   Eosinophils Relative 1 %   Eosinophils Absolute 0.1 0.0 - 0.5 K/uL   Basophils Relative 0 %   Basophils Absolute 0.0 0.0 - 0.1 K/uL   Immature Granulocytes 0 %   Abs Immature Granulocytes 0.03 0.00 - 0.07 K/uL  Comprehensive metabolic panel  Result Value Ref Range   Sodium 136 135 - 145 mmol/L   Potassium 3.9 3.5 - 5.1 mmol/L   Chloride 109 98 - 111 mmol/L   CO2 20 (L) 22 - 32 mmol/L   Glucose, Bld 88 70 - 99 mg/dL   BUN 7 6 - 20 mg/dL   Creatinine, Ser 0.82 0.44 - 1.00 mg/dL   Calcium 9.5 8.9 - 10.3 mg/dL   Total Protein 6.7 6.5 - 8.1 g/dL   Albumin 2.8 (L) 3.5 - 5.0 g/dL   AST 27 15 - 41 U/L   ALT 20 0 - 44 U/L   Alkaline Phosphatase 148 (H) 38 - 126 U/L   Total  Bilirubin 0.4 0.3 - 1.2 mg/dL   GFR, Estimated >60 >60 mL/min   Anion gap 7 5 - 15  RPR  Result Value Ref Range   RPR Ser Ql NON REACTIVE NON REACTIVE  CBC  Result Value Ref Range   WBC 11.5 (H) 4.0 - 10.5 K/uL   RBC 3.65 (L) 3.87 - 5.11 MIL/uL   Hemoglobin 10.9 (L) 12.0 - 15.0 g/dL   HCT 33.6 (L) 36.0 - 46.0 %   MCV 92.1 80.0 - 100.0 fL   MCH 29.9 26.0 - 34.0 pg   MCHC 32.4 30.0 - 36.0 g/dL   RDW 13.5 11.5 - 15.5 %   Platelets 201 150 - 400 K/uL   nRBC 0.9 (H) 0.0 - 0.2 %  OB RESULTS CONSOLE RPR  Result Value Ref Range   RPR Nonreactive   OB RESULTS CONSOLE HIV antibody  Result Value Ref Range   HIV Non-reactive   OB RESULTS CONSOLE Rubella Antibody  Result Value Ref Range   Rubella Immune   OB RESULTS CONSOLE Hepatitis B surface antigen  Result Value Ref Range   Hepatitis B Surface Ag Negative   RPR  Result Value Ref Range   RPR Ser Ql NON REACTIVE NON REACTIVE  CBC  Result Value Ref Range   WBC 11.6 (H) 4.0 - 10.5 K/uL   RBC 3.80 (L) 3.87 - 5.11 MIL/uL   Hemoglobin 11.5 (L) 12.0 - 15.0 g/dL   HCT 35.2 (L) 36.0 - 46.0 %   MCV 92.6 80.0 - 100.0 fL   MCH 30.3 26.0 - 34.0 pg   MCHC 32.7 30.0 - 36.0 g/dL   RDW 14.3 11.5 - 15.5 %   Platelets 210 150 - 400 K/uL   nRBC 0.3 (H) 0.0 - 0.2 %  Comprehensive metabolic panel  Result Value Ref Range   Sodium 136 135 - 145 mmol/L   Potassium 3.9 3.5 - 5.1 mmol/L   Chloride 109 98 - 111 mmol/L   CO2 20 (L) 22 - 32 mmol/L   Glucose, Bld 84 70 - 99 mg/dL   BUN 10 6 - 20 mg/dL   Creatinine, Ser 0.86 0.44 - 1.00 mg/dL   Calcium 8.8 (L) 8.9 - 10.3 mg/dL   Total Protein 6.0 (L) 6.5 - 8.1 g/dL   Albumin 2.6 (L) 3.5 - 5.0 g/dL   AST 37 15 - 41 U/L  ALT 29 0 - 44 U/L   Alkaline Phosphatase 135 (H) 38 - 126 U/L   Total Bilirubin 0.7 0.3 - 1.2 mg/dL   GFR, Estimated >60 >60 mL/min   Anion gap 7 5 - 15  Lactate dehydrogenase  Result Value Ref Range   LDH 233 (H) 98 - 192 U/L  Type and screen [place]   Result Value Ref Range   ABO/RH(D) B POS    Antibody Screen NEG    Sample Expiration      09/15/2021,2359 Performed at [facility] [address], [place], [place] [address]   Type and screen [place]  Result Value Ref Range   ABO/RH(D) B POS    Antibody Screen NEG    Sample Expiration      09/22/2021,2359 Performed at [facility], [place] [address] [place], [place] [address]   GC/Chlamydia probe amp ([place])not at [facility]  Result Value Ref Range   Chlamydia Negative    Neisseria Gonorrhea Negative    Comment Normal Reference [place] Chlamydia - Negative    Comment      Normal Reference Range Neisseria Gonorrhea - Negative     A/P: Lauren Garner is a 36 y.o. G2P0101 @ [redacted]w[redacted]d admitted for PPROM of Twin A in Di/Di Twin Gestation.  PPROM - Doing well - Latency Abx: S/p Azithromycin, Amp/Amox x 7 days - FLM: S/p BMZ complete on 5/16 - FWB: Reactive NST x 2, BPP 8/8 on 5/15 - S/p NICU consult - Inpatient management until delivery at 34 weeks, per MFM      - Repeat CS scheduled for today at 2pm  Fetal growth restriction in both twins - See last growth [place](imaging tab) - Followed by MFM - see consult note on 5/15 by Dr. FAnnamaria Boots History of CS x 1 (desires repeat) - Consents obtained on 5/15 - signed and witnessed for procedure and blood  Gestational HTN - Three mild range Bps this admission - Suspect spurious Bps vs gestational HTN vs preeclampsia  - Patient is asymptomatic with majority normal blood pressures - Preeclampsia labs unremarkable, will defer PCR until 5/24 when foley catheter is placed for today's [place] DO

## 2021-09-23 LAB — CBC
HCT: 33.5 % — ABNORMAL LOW (ref 36.0–46.0)
Hemoglobin: 11.1 g/dL — ABNORMAL LOW (ref 12.0–15.0)
MCH: 30.4 pg (ref 26.0–34.0)
MCHC: 33.1 g/dL (ref 30.0–36.0)
MCV: 91.8 fL (ref 80.0–100.0)
Platelets: 211 10*3/uL (ref 150–400)
RBC: 3.65 MIL/uL — ABNORMAL LOW (ref 3.87–5.11)
RDW: 14.8 % (ref 11.5–15.5)
WBC: 17.3 10*3/uL — ABNORMAL HIGH (ref 4.0–10.5)
nRBC: 0.1 % (ref 0.0–0.2)

## 2021-09-23 NOTE — Lactation Note (Signed)
This note was copied from a baby's chart. Lactation Consultation Note  Patient Name: Lauren Garner [id number] Date: 09/23/2021   Age:36 hours  Visited with mom of 66 hours old LPI NICU twins, she was groggy/sleepy at this time and asked LC to do lactation assessment once she has a clear mind. She has been set up with a DEBP already and she initiated pumping but voiced "nothing came out". Reviewed expectations and encouraged her to continue pumping even she doesn't see volume/drops yet. [name] to call for [facility] once she's ready to have her initial assessment.   Karem Tomaso S Laval Cafaro 09/23/2021, 11:15 AM

## 2021-09-23 NOTE — Progress Notes (Signed)
Subjective: Postpartum Day 1: Cesarean Delivery Patient reports tolerating PO.  Pain is well controlled. Foley just removed less than hour ago .. no void yet. She is excited to go see twins in NICU.   Objective: Vital signs in last 24 hours: Temp:  [97.6 F (36.4 C)-98 F (36.7 C)] 98 F (36.7 C) (05/25 0740) Pulse Rate:  [56-95] 76 (05/25 1739) Resp:  [16-18] 18 (05/25 1739) BP: (117-143)/(57-92) 143/78 (05/25 1739) SpO2:  [98 %-100 %] 100 % (05/25 1739)  Physical Exam:  General: alert, cooperative, and no distress Lochia: appropriate Uterine Fundus: firm Incision: Bandage clean dry and intact.  DVT Evaluation: No evidence of DVT seen on physical exam.  Recent Labs    09/22/21 0752 09/23/21 0521  HGB 10.9* 11.1*  HCT 34.0* 33.5*    Assessment/Plan: Status post Cesarean section. Doing well postoperatively.  Continue current care Encouraged ambulation  Awaiting bladder function  Gestational hypertension. Bp mostly normal will continue to monitor for now.  Christophe Louis 09/23/2021, 6:33 PM

## 2021-09-24 LAB — SURGICAL PATHOLOGY

## 2021-09-24 MED ORDER — IBUPROFEN 600 MG PO TABS: 600.0000 mg | ORAL_TABLET | Freq: Four times a day (QID) | ORAL | 1 refills | Status: DC | PRN

## 2021-09-24 MED ORDER — HYDROCODONE-ACETAMINOPHEN 5-325 MG PO TABS: 1.0000 | ORAL_TABLET | Freq: Four times a day (QID) | ORAL | 0 refills | Status: DC | PRN

## 2021-09-24 MED FILL — Heparin Sodium (Porcine) Inj 1000 Unit/ML: INTRAMUSCULAR | Qty: 30 | Status: AC

## 2021-09-24 MED FILL — Sodium Chloride IV Soln 0.9%: INTRAVENOUS | Qty: 1000 | Status: AC

## 2021-09-24 NOTE — Discharge Summary (Signed)
Postpartum Discharge Summary  Date of Service: 09/24/21      Patient Name: Lauren Garner DOB: [birth year] MRN: [id number]  Date of admission: 09/12/2021 Delivery date:   Lauren, Garner [[id number]]  09/22/2021    Lauren, Garner [[id number]]  09/22/2021  Delivering provider:    [place], Garner [[id number]]  Lauren Garner    Lauren, Garner [[id number]]  [name], Lauren Garner  Date of discharge: 09/24/2021  Admitting diagnosis: Preterm premature rupture of membranes [O42.919] Fetal growth restriction of both twins Intrauterine pregnancy: [redacted]w[redacted]d    Secondary diagnosis:  Principal Problem:   Preterm premature rupture of membranes Gestational Hypertension History of PPROM/preterm delivery at 313weeks Advanced Maternal Age Asthma  History of cesarean section Obesity (BMI 34) Pre-diabetes Additional problems: None    Discharge diagnosis: Preterm Pregnancy Delivered                                              Post partum procedures: None Augmentation: N/A Complications: None  Hospital course: Sceduled C/S   36y.o. yo G2P0203 at 356w2das admitted to the hospital 09/12/2021 for scheduled cesarean section with the following indication: PPROM/twins . She was originally admitted for PPROM on 09/12/2021. Delivery details are as follows:  Membrane Rupture Time/Date:    Lauren, Garner[[id number]]2:35 AM    Lauren Garner[[id number]]2:35 AM ,   Lauren, Garner[[id number]]09/12/2021    Lauren, Garner[[id number]]09/12/2021   Delivery Method:   Lauren Garner[[id number]]C-Section, Low Transverse    Lauren, Garner[[id number]]C-Section, Low Transverse  Details of operation can be found in separate operative note.  Patient had an uncomplicated postpartum course.  She is ambulating, tolerating a regular diet, passing flatus, and urinating well. Patient  is discharged home in stable condition on  09/24/21        Newborn Data: Birth date:   Lauren, Garner[[id number]]09/22/2021    Lauren, Garner[[id number]]09/22/2021  Birth time:   Lauren, Garner[[id number]]3:25 PM    Lauren, Garner[[id number]]3:25 PM  Gender:   Lauren, Garner[[id number]]Female    Lauren, Garner[[id number]]Female  Living status:   Lauren, Garner[[id number]]Living    Lauren, Garner[[id number]]Living  Apgars:   Lauren, Garner[[id number]]8 [address]0[[id number]]  9   Lauren, Garner[[id number]]9 [address][[id number]]9  Weight:   Lauren, Garner[[id number]]1100 g    Lauren, Garner[[id number]]1590 g     Magnesium Sulfate received: No BMZ received: Yes Rhophylac:N/A MMR:N/A T-DaP:Given prenatally Flu: N/A Transfusion:No  Physical exam  Vitals:   09/23/21 1739 09/23/21 1947 09/23/21 2318 09/24/21 0805  BP: (!) 143/78 129/75 (!) 141/77 136/74  Pulse: 76 96 80 87  Resp: 18 16 17 16   Temp:  98.5 F (36.9 C) 98.4 F (36.9 C) 98.3 F (36.8 C)  TempSrc:  Oral Oral Oral  SpO2: 100% 100% 100% 100%  Weight:      Height:       Gen: NAD, pleasant and cooperative CV: RRR Resp: CTAB, no wheezes/rales/rhonchi Abdomen: soft, non-distended, non-tender  throughout Uterus: firm, non-tender, below umbilicus Incision: c/d/i, bandage in place  Ext: Trace bilateral pedal edema, no bilateral calf tenderness  Labs: Lab Results  Component Value Date   WBC 17.3 (H) 09/23/2021   HGB 11.1 (L) 09/23/2021   HCT 33.5 (L) 09/23/2021   MCV 91.8 09/23/2021   PLT 211 09/23/2021      Latest Ref Rng & Units 09/20/2021    8:25 AM  CMP  Glucose 70 - 99 mg/dL 84    BUN 6 - 20 mg/dL 10    Creatinine 0.44 - 1.00 mg/dL 0.86    Sodium 135 - 145  mmol/L 136    Potassium 3.5 - 5.1 mmol/L 3.9    Chloride 98 - 111 mmol/L 109    CO2 22 - 32 mmol/L 20    Calcium 8.9 - 10.3 mg/dL 8.8    Total Protein 6.5 - 8.1 g/dL 6.0    Total Bilirubin 0.3 - 1.2 mg/dL 0.7    Alkaline Phos 38 - 126 U/L 135    AST 15 - 41 U/L 37    ALT 0 - 44 U/L 29     Edinburgh Score:     View : No data to display.            After visit meds:  Allergies as of 09/24/2021       Reactions   Percocet [oxycodone-acetaminophen] Itching   **patient says its the OXY part of the percocet, but she can take tylenol just fine   Vicks Vapo Steam [camphor-menthol]    Pineapple Rash        Medication List     STOP taking these medications    aspirin 81 MG chewable tablet   calcium carbonate 750 MG chewable tablet Commonly known as: TUMS EX       TAKE these medications    albuterol 108 (90 Base) MCG/ACT inhaler Commonly known as: VENTOLIN HFA Inhale into the lungs every 6 (six) hours as needed for wheezing or shortness of breath.   famotidine 20 MG tablet Commonly known as: PEPCID Take 20 mg by mouth 2 (two) times daily.   ferrous sulfate 325 (65 FE) MG tablet Take 325 mg by mouth daily with breakfast.   HYDROcodone-acetaminophen 5-325 MG tablet Commonly known as: NORCO/VICODIN Take 1-2 tablets by mouth every 6 (six) hours as needed for moderate pain or severe pain.   ibuprofen 600 MG tablet Commonly known as: ADVIL Take 1 tablet (600 mg total) by mouth every 6 (six) hours as needed for mild pain, moderate pain or cramping.   prenatal vitamin w/FE, FA 29-1 MG Chew chewable tablet Chew 1 tablet by mouth daily at 12 noon.         Discharge home in stable condition Infant Feeding: Breast and TPN Infant Disposition:NICU Discharge instruction: per After Visit Summary and Postpartum booklet. Activity: Advance as tolerated. Pelvic rest for 6 weeks.  Diet: routine diet Anticipated Birth Control: Nexplanon Postpartum Appointment:6  weeks Additional Postpartum F/U: Postpartum Depression checkup, Incision check 2 weeks, and BP check 1 week Future Appointments:No future appointments. Follow up Visit:  Follow-up Information     Drema Dallas, DO Follow up in 1 week(s).   Specialty: Obstetrics and Gynecology Why: Our office will call you to arrange a 1 week blood pressure check, 2 week incision check, and 6 week postpartum visit. Contact information: [address] [place] [address] [phone]  09/24/2021 Drema Dallas, DO

## 2021-09-24 NOTE — Progress Notes (Signed)
Postpartum Note Day #2  S:  Patient doing well.  Pain controlled.  Tolerating regular diet.   Ambulating and voiding without difficulty.   Desires discharge home today. Feeling emotional about going home but does desire to see her son at home as well. Denies fevers, chills, chest pain, SOB, N/V, or worsening bilateral LE edema.  Lochia: Minimal Infant feeding:  Breast/TPN Circumcision:  Desires for female infant when ready Contraception:  Nexplanon  O: Temp:  [98 F (36.7 C)-98.5 F (36.9 C)] 98.4 F (36.9 C) (05/25 2318) Pulse Rate:  [68-96] 80 (05/25 2318) Resp:  [16-18] 17 (05/25 2318) BP: (128-143)/(75-79) 141/77 (05/25 2318) SpO2:  [98 %-100 %] 100 % (05/25 2318) Gen: NAD, pleasant and cooperative CV: RRR Resp: CTAB, no wheezes/rales/rhonchi Abdomen: soft, non-distended, non-tender throughout Uterus: firm, non-tender, below umbilicus Incision: c/d/i, bandage in place  Ext: Trace bilateral pedal edema, no bilateral calf tenderness  Labs:  Recent Labs    09/22/21 0752 09/23/21 0521  HGB 10.9* 11.1*    A/P: Patient is a 36 y.o. G9F6213 POD#2 s/p repeat LTCS.  S/p LTCS - Pain well controlled  - GU: UOP is adequate - GI: Tolerating regular diet - Activity: encouraged sitting up to chair and ambulation as tolerated - DVT Prophylaxis: Ambulation - Labs: stable as above  Gestational HTN - Mostly normotensive - Medication: None - 1 week BP check in the office will be scheduled   Disposition:  D/C home today.   Drema Dallas, DO [phone] (office)

## 2021-09-24 NOTE — Progress Notes (Signed)
Pt discharged in stable condition with all belongings after discharge instructions given.

## 2021-09-24 NOTE — Lactation Note (Signed)
This note was copied from a baby's chart.  NICU Lactation Consultation Note  Patient Name: Lauren Garner [id number] Date: 09/24/2021 Age:36 hours   Subjective Reason for consult: Initial assessment Mother bf her last baby for 2 years. She plans to bf and pump for her twins. She latched baby A yesterday. We discussed IDF and pumping schedule. I provided written resources. Mother denies hx of breast surgery/trauma. She has an electric pump at home for use p d/c.   Objective Infant data: Mother's Current Feeding Choice: Breast Milk and Donor Milk  Infant feeding assessment Scale for Readiness: 3    Maternal data: G2P0203  C-Section, Low Transverse  Does the patient have breastfeeding experience prior to this delivery?: Yes How long did the patient breastfeed?: 2 years   Pump: DEBP, Personal (spectra)  Intervention/Plan Interventions: Infant Driven Feeding Algorithm education; Education; [employer] brochure; "The NICU and Your Baby" book  Tools: Pump Pump Education: Setup, frequency, and cleaning; Milk Storage  Plan: Consult Status: Follow-up  NICU Follow-up type: New admission follow up; Verify onset of copious milk; Maternal D/C visit; Verify absence of engorgement   Mother to pump q3 and bring any EBM to NICU.  Gwynne Edinger 09/24/2021, 9:15 AM

## 2021-09-25 ENCOUNTER — Ambulatory Visit: Payer: Self-pay

## 2021-09-25 NOTE — Lactation Note (Signed)
This note was copied from a baby's chart.  NICU Lactation Consultation Note  Patient Name: Maurissa Ambrose [id number] Date: 09/25/2021 Age:36 years   Subjective Reason for consult: Follow-up assessment; Mother's request Mother continues to pump often but has not yet had onset of copious milk. I observed a pumping session and provided re-education / pumping strategies. Mother was pleased to see slight increase in volume during this session.   Objective Infant data: Mother's Current Feeding Choice: Breast Milk and Donor Milk  Infant feeding assessment Scale for Readiness: 4   Maternal data: G2P0203  C-Section, Low Transverse Pumping frequency: q3h increasing drops today as expected   Pump: DEBP, Personal  Assessment Maternal: Milk volume: Normal   Intervention/Plan Interventions: Education  Tools: Pump  Plan: Consult Status: NICU follow-up  NICU Follow-up type: Verify onset of copious milk  Mother to continue pumping q3h  Gwynne Edinger 09/25/2021, 5:47 PM

## 2021-09-28 ENCOUNTER — Ambulatory Visit: Payer: Self-pay

## 2021-09-28 NOTE — Lactation Note (Signed)
This note was copied from a baby's chart.  NICU Lactation Consultation Note  Patient Name: Lauren Garner [id number] Date: 09/28/2021 Age:36 days   Subjective Reason for consult: Follow-up assessment; Mother's request; NICU baby; Multiple gestation; Late-preterm 34-36.6wks  Lactation followed up with Lauren Garner by request. She had some questions about the use of her breast pump and where to obtain a pumping bra. I provided the information and instructed her to switch to the maintain setting now that her milk volume is over 20 mls/session.  I reinforced recommended pumping frequency of q3 hours, and emphasized helpfulness of nighttime pumping to increasing milk volume.  Lauren Garner Nipple states that Baby A is currently latching better than Baby B. She will continue to present both babies with opportunities to practice their breastfeeding.   I encouraged her to call for assistance with breastfeeding, as needed. Lauren Garner verbalized understanding.  Objective Infant data: Mother's Current Feeding Choice: Breast Milk and Donor Milk  Infant feeding assessment Scale for Readiness: 3  Maternal data: G2P0203  C-Section, Low Transverse  Current breast feeding challenges:: NICU  Does the patient have breastfeeding experience prior to this delivery?: Yes How long did the patient breastfeed?: 2 years  Pumping frequency: rec q3 hours Pumped volume: 0 mL (40+ mls)   Pump: DEBP, Personal  Assessment  Maternal: Milk volume: Normal   Intervention/Plan Interventions: Breast feeding basics reviewed; Education  Pump Education: Setup, frequency, and cleaning  Plan: Consult Status: Follow-up  NICU Follow-up type: New admission follow up; Verify onset of copious milk; Verify absence of engorgement    Lenore Manner 09/28/2021, 3:13 PM

## 2021-09-29 ENCOUNTER — Ambulatory Visit: Payer: Self-pay

## 2021-09-29 NOTE — Lactation Note (Signed)
This note was copied from a baby's chart.  NICU Lactation Consultation Note  Patient Name: Lauren Garner [id number] Date: 09/29/2021 Age:36 days  Subjective Reason for consult: NICU baby; Infant < 6lbs; Multiple gestation; Late-preterm 34-36.6wks; Follow-up assessment; Mother's request  Visited with mom of 3 45/69 weeks old (adjusted) NICU twins, she's a P2 and reports the full onset of lactogenesis II. Ms. Miskell noted that baby Girl "B" is showing some feeding cues and she'd like to schedule a feeding assist for tomorrow at 12 pm. She already took baby Girl "B" to breast for the 3 pm feeding today and she did a few sucks. Mom and baby Girl "B" doing STS when entered the room, praised for her efforts. Reviewed feeding cues, pumping schedule, benefits of STS care and IDF 1/2. Ms. Prine is aware of the importance of pumping prior feedings at the breast at this age, and post-feedings after babies' discharges.  Objective Infant data: Mother's Current Feeding Choice: Breast Milk and Donor Milk Infant feeding assessment Scale for Readiness: 2  Maternal data: G2P0203  C-Section, Low Transverse Pumping frequency: 6 times/24 hours Pumped volume: 47 mL Pump: DEBP, Personal (Spectra)  Assessment Infant: In NICU  Maternal: Milk volume: Normal  Intervention/Plan Interventions: Breast feeding basics reviewed; DEBP; Education; Infant Driven Feeding Algorithm education Tools: Pump Pump Education: Setup, frequency, and cleaning; Milk Storage  Plan of care: Encouraged mom to continue pumping consistently every 3 hours, at least 8 pumping sessions/24 hours She'll continue doing as much STS care as she can She'll call LC for feeding assistance as needed  No other support person at this time. All questions and concerns answered, family to contact [facility] services PRN.  Consult Status: NICU follow-up NICU Follow-up type: Assist with IDF-1 (Mother to pre-pump before  breastfeeding)   Khamauri Bauernfeind Francene Boyers 09/29/2021, 4:30 PM

## 2021-09-30 ENCOUNTER — Telehealth (HOSPITAL_COMMUNITY): Payer: Self-pay | Admitting: *Deleted

## 2021-09-30 ENCOUNTER — Ambulatory Visit: Payer: Self-pay

## 2021-09-30 NOTE — Telephone Encounter (Signed)
Left phone voicemail message.  Odis Hollingshead, RN 09-30-2021 at 3:04pm

## 2021-09-30 NOTE — Lactation Note (Addendum)
This note was copied from a baby's chart.  NICU Lactation Consultation Note  Patient Name: Lauren Garner NMMHW'K Date: 09/30/2021 Age:36 years   Subjective Reason for consult: Follow-up assessment; Mother's request; NICU baby; Multiple gestation; Late-preterm 36-36.6wks  Lactation assisted with breastfeeding with baby A. We used a size 16 NS to assist. Baby latched initially, but was able to maintain attachment to the breast with the assistance of a NS. We noted rhythmic suckling sequences with pauses and rest breaks appropriate for baby's gestational age.   I encouraged Ms. [name] to latch babies with feedings and allo them to develop their sucking skills. We discussed feeding cues and when to stop an attempt.  Objective Infant data: Mother's Current Feeding Choice: Breast Milk and Donor Milk  Infant feeding assessment Scale for Readiness: 2    Maternal data: G2P0203  C-Section, Low Transverse  Current breast feeding challenges:: NICU  Pumping frequency: 6 times in 24 hours Pumped volume: 60 mL (1-2 ounces)   Pump: Personal (Spectra)  Assessment Infant: LATCH Score: 7   Maternal: Milk volume: Normal   Intervention/Plan Interventions: Breast feeding basics reviewed; Assisted with latch; Adjust position; Support pillows; Education  Tools: Nipple Shields Pump Education: Setup, frequency, and cleaning; Milk Storage Nipple shield size: 16  Plan: Consult Status: NICU follow-up  NICU Follow-up type: Weekly NICU follow up; Assist with IDF-1 (Mother to pre-pump before breastfeeding)    Lauren Garner 09/30/2021, 12:27 PM

## 2021-10-05 ENCOUNTER — Ambulatory Visit: Payer: Self-pay

## 2021-10-05 NOTE — Lactation Note (Deleted)
This note was copied from a baby's chart.  NICU Lactation Consultation Note  Patient Name: Lauren Garner Date: 10/05/2021 Age:36 days   Subjective Reason for consult: Follow-up assessment; NICU baby; Multiple gestation; Mother's request  Lactation followed up with Lauren Garner after notification from RN that baby B latched spontaneously while being held STS around 1600. RN observed feeding and documented a latch score.  Lactation to follow up tomorrow (6/7) at 1100 to observe and assist with breastfeeding baby B.   Objective Infant data: Mother's Current Feeding Choice: Breast Milk and Donor Milk  Infant feeding assessment Scale for Readiness: 2 Scale for Quality: 3  Maternal data: G2P0203  C-Section, Low Transverse  Current breast feeding challenges:: NICU; twins  Pumped volume: 45 mL   Pump: Personal (Spectra)  Assessment Infant: LATCH Score: 9  Intervention/Plan Interventions: Education  Plan: Consult Status: NICU follow-up  NICU Follow-up type: Weekly NICU follow up    Lauren Garner 10/05/2021, 5:29 PM

## 2021-10-05 NOTE — Lactation Note (Signed)
This note was copied from a baby's chart. NICU Lactation Consultation Note  Patient Name: Tamyka Bezio MVHQI'O Date: 10/05/2021 Age:36   Subjective Reason for consult: Follow-up assessment; NICU baby; Multiple gestation; Mother's request  Lactation followed up with Ms. Radziewicz after notification from RN that baby A latched spontaneously while being held STS around 1600. RN observed feeding and documented a latch score.  Lactation to follow up tomorrow (6/7) at 1100 to observe and assist with breastfeeding baby A.   Objective Infant data: Mother's Current Feeding Choice: Breast Milk and Donor Milk  Infant feeding assessment Scale for Readiness: 2 Scale for Quality: 3  Maternal data: G2P0203  C-Section, Low Transverse  Current breast feeding challenges:: NICU; twins  Pumped volume: 45 mL   Pump: Personal (Spectra)  Assessment Infant: LATCH Score: 9  Intervention/Plan Interventions: Education  Plan: Consult Status: NICU follow-up  NICU Follow-up type: Weekly NICU follow up    Lenore Manner 10/05/2021, 5:29 PM

## 2021-10-06 ENCOUNTER — Ambulatory Visit: Payer: Self-pay

## 2021-10-06 NOTE — Lactation Note (Signed)
This note was copied from a baby's chart.  NICU Lactation Consultation Note  Patient Name: Lauren Garner INOMV'E Date: 10/06/2021 Age:36 wk.o.   Subjective Reason for consult: Follow-up assessment; Mother's request; Late-preterm 34-36.6wks  Lactation followed up with Baby Lauren Garner. I observed him latch to the left breast. He was initially disorganized, but he settled into rhythmic suckling sequences. He appeared relaxed and quiet alert.   He fed for approximately 10 minutes of active feeding with 5-7 minutes of non-nutritive feeding. Milk residue noted in the NS.  Lactation to follow up. Lauren Garner is interested in boosting her milk production. I indicated that I would reach out to [facility] about obtaining a Symphony pump on loan.  Objective Infant data: Mother's Current Feeding Choice: Breast Milk  Infant feeding assessment Scale for Readiness: 2  Maternal data: G2P0203  C-Section, Low Transverse  Current breast feeding challenges:: twins; NICU  Pumping frequency: q6-7 times/day Pumped volume: 60 mL   Pump: Personal (Spectra)  Assessment Infant: LATCH Score: 7  No data recorded  Maternal: No data recorded  Intervention/Plan Interventions: Breast feeding basics reviewed; Assisted with latch; Skin to skin; Pre-pump if needed; Education; Support pillows  Tools: Nipple Shields Pump Education: Setup, frequency, and cleaning Nipple shield size: 16  Plan: Consult Status: NICU follow-up  NICU Follow-up type: Weekly NICU follow up    Lauren Garner 10/06/2021, 10:56 AM

## 2021-10-10 ENCOUNTER — Ambulatory Visit: Payer: Self-pay

## 2021-10-10 ENCOUNTER — Emergency Department (HOSPITAL_COMMUNITY)
Admission: EM | Admit: 2021-10-10 | Discharge: 2021-10-10 | Disposition: A | Discharge status: Home / Self Care | Payer: BC Managed Care – PPO | Attending: Emergency Medicine | Admitting: Emergency Medicine

## 2021-10-10 ENCOUNTER — Other Ambulatory Visit: Payer: Self-pay

## 2021-10-10 DIAGNOSIS — T7840XA Allergy, unspecified, initial encounter: Secondary | ICD-10-CM | POA: Diagnosis present

## 2021-10-10 MED ORDER — METHYLPREDNISOLONE SODIUM SUCC 125 MG IJ SOLR
125.0000 mg | Freq: Once | INTRAMUSCULAR | Status: AC
  Administered 2021-10-10: 125 mg via INTRAVENOUS
  Filled 2021-10-10: qty 2

## 2021-10-10 MED ORDER — PREDNISONE 10 MG PO TABS: 40.0000 mg | ORAL_TABLET | Freq: Every day | ORAL | 0 refills | Status: DC

## 2021-10-10 MED ORDER — EPINEPHRINE 0.3 MG/0.3ML IJ SOAJ: 0.3000 mg | INTRAMUSCULAR | 0 refills | Status: AC | PRN

## 2021-10-10 NOTE — Lactation Note (Addendum)
This note was copied from a baby's chart.  NICU Lactation Consultation Note  Patient Name: Renita Brocks XMIWO'E Date: 10/10/2021 Age:36   Subjective Reason for consult: Follow-up assessment; NICU baby; Multiple gestation  Lactation followed up with Ms. Swisher as she is preparing to take baby A, Makena, home. She requests help with latching [name] on 6/12. She is interested in checking her nipple shield size and possibly using a size 20. I made an appointment.  I reviewed lactation services with mom. She has a Symphony pump now that she obtained from [facility], and she states that having it has helped with her milk supply.   She will need continued support with both babies, and I encouraged her to reach out for assistance.  Objective  Scale for Readiness: 1 Scale for Quality: 2     Maternal data: G2P0203  C-Section, Low Transverse Pump: Personal (Spectra) - now also using a Symphony pump at home via [employer]  Assessment Infant:  Feeding Status: Ad lib    Intervention/Plan  Plan: Consult Status: Complete   Lenore Manner 10/10/2021, 5:02 PM

## 2021-10-10 NOTE — ED Triage Notes (Signed)
Patient BIB EMS for evaluation of allergic reaction after being expose to Cocoa Butter oil. Given Benadryl 50 mg IV by EMS.  Hives and itching noted on arrival.  EMS reports improvement in reaction after medication

## 2021-10-10 NOTE — ED Provider Notes (Signed)
[facility] EMERGENCY DEPARTMENT Provider Note   CSN: [id number] Arrival date & time: 10/10/21  2112     History  Chief Complaint  Patient presents with   Allergic Reaction    Lauren Garner is a 36 y.o. female.  36 year old female with prior medical history as detailed below presents for evaluation.  Patient reports that she was at home.  Was exposed to cocoa butter oral.  She reports that this causes her to have an allergic reaction.  Shortly after exposure she developed diffuse itching rash across her face and upper body.  EMS administered 50 mg of Benadryl IV during transport.  Patient did not receive epinephrine.  Patient feels significantly improved upon arrival to the ED.  She denies difficulty swallowing or difficulty breathing.  She is otherwise without complaint.  The history is provided by the patient and medical records.  Allergic Reaction Presenting symptoms: itching and rash   Severity:  Moderate Duration:  30 minutes Prior allergic episodes:  Food/nut allergies Context: cosmetics        Home Medications Prior to Admission medications   Medication Sig Start Date End Date Taking? Authorizing Provider  EPINEPHrine 0.3 mg/0.3 mL IJ SOAJ injection Inject 0.3 mg into the muscle as needed for anaphylaxis. 10/10/21  Yes Valarie Merino, MD  predniSONE (DELTASONE) 10 MG tablet Take 4 tablets (40 mg total) by mouth daily. 10/10/21  Yes Valarie Merino, MD  albuterol (VENTOLIN HFA) 108 (90 Base) MCG/ACT inhaler Inhale into the lungs every 6 (six) hours as needed for wheezing or shortness of breath.    [provider]  famotidine (PEPCID) 20 MG tablet Take 20 mg by mouth 2 (two) times daily.    [provider]  ferrous sulfate 325 (65 FE) MG tablet Take 325 mg by mouth daily with breakfast.    [provider]  HYDROcodone-acetaminophen (NORCO/VICODIN) 5-325 MG tablet Take 1-2 tablets by mouth every 6 (six) hours as  needed for moderate pain or severe pain. 09/24/21   Drema Dallas, DO  ibuprofen (ADVIL) 600 MG tablet Take 1 tablet (600 mg total) by mouth every 6 (six) hours as needed for mild pain, moderate pain or cramping. 09/24/21   Drema Dallas, DO  prenatal vitamin w/FE, FA (NATACHEW) 29-1 MG CHEW chewable tablet Chew 1 tablet by mouth daily at 12 noon.    [provider]      Allergies    Cocoa butter, Percocet [oxycodone-acetaminophen], Vicks vapo steam [camphor-menthol], and Pineapple    Review of Systems   Review of Systems  Skin:  Positive for itching and rash.  All other systems reviewed and are negative.   Physical Exam Updated Vital Signs BP (!) 103/59   Pulse 75   Resp 19   LMP 01/25/2021   SpO2 100%  Physical Exam Vitals and nursing note reviewed.  Constitutional:      General: She is not in acute distress.    Appearance: Normal appearance. She is well-developed.  HENT:     Head: Normocephalic and atraumatic.  Eyes:     Conjunctiva/sclera: Conjunctivae normal.     Pupils: Pupils are equal, round, and reactive to light.  Cardiovascular:     Rate and Rhythm: Normal rate and regular rhythm.     Heart sounds: Normal heart sounds.  Pulmonary:     Effort: Pulmonary effort is normal. No respiratory distress.     Breath sounds: Normal breath sounds.  Abdominal:     General: There is  no distension.     Palpations: Abdomen is soft.     Tenderness: There is no abdominal tenderness.  Musculoskeletal:        General: No deformity. Normal range of motion.     Cervical back: Normal range of motion and neck supple.  Skin:    General: Skin is warm and dry.     Comments: Faint diffuse urticarial rash across face, chest, back, and upper extremities  Neurological:     General: No focal deficit present.     Mental Status: She is alert and oriented to person, place, and time.     ED Results / Procedures / Treatments   Labs (all labs ordered are listed, but only  abnormal results are displayed) Labs Reviewed - No data to display  EKG EKG Interpretation  Date/Time:  'Sunday October 10 2021 21:12:36 EDT Ventricular Rate:  89 PR Interval:  128 QRS Duration: 69 QT Interval:  376 QTC Calculation: 458 R Axis:   50 Text Interpretation: Sinus rhythm Borderline repolarization abnormality Confirmed by Siya Flurry ([id number]) on 10/10/2021 9:18:15 PM  Radiology No results found.  Procedures Procedures    Medications Ordered in ED Medications  methylPREDNISolone sodium succinate (SOLU-MEDROL) 125 mg/2 mL injection 125 mg (125 mg Intravenous Given 10/10/21 2139)    ED Course/ Medical Decision Making/ A&P                           Medical Decision Making Risk Prescription drug management.    Medical Screen Complete  This patient presented to the ED with complaint of allergic reaction.  This complaint involves an extensive number of treatment options. The initial differential diagnosis includes, but is not limited to, allergic reaction  This presentation is: Acute, Previously Undiagnosed, Uncertain Prognosis, Complicated, and Systemic Symptoms  Patient with exposure to cocoa butter and subsequent allergic type reaction.  Patient with diffuse urticaria.  No respiratory involvement noted.  Patient feels significantly improved after Benadryl and Solu-Medrol.    Importance of close follow-up is stressed.  Strict return precautions given and understood.  Additional history obtained:  Additional history obtained from EMS External records from outside sources obtained and reviewed including prior ED visits and prior Inpatient records.    Medicines ordered:  I ordered medication including Solu-Medrol for logic reaction Reevaluation of the patient after these medicines showed that the patient: improved   Problem List / ED Course:  Allergic reaction, urticaria   Reevaluation:  After the interventions noted above, I reevaluated the  patient and found that they have: improved   Disposition:  After consideration of the diagnostic results and the patients response to treatment, I feel that the patent would benefit from close outpatient follow-up.          Final Clinical Impression(s) / ED Diagnoses Final diagnoses:  Allergic reaction, initial encounter    Rx / DC Orders ED Discharge Orders          Ordered    predniSONE (DELTASONE) 10 MG tablet  Daily        10/10/21 2249    EPINEPHrine 0.3 mg/0.3 mL IJ SOAJ injection  As needed        06'$ /11/23 2249              Valarie Merino, MD 10/10/21 2253

## 2021-10-10 NOTE — Discharge Instructions (Addendum)
Return for any problem.   Take prednisone as prescribed for the next 4 days.  This will help prevent rebound allergic symptoms.  You have been prescribed an EpiPen.  If you ever have a significant allergic reaction that causes you to have trouble breathing or swelling of her tongue or lips use the EpiPen and call 911.

## 2021-10-11 ENCOUNTER — Ambulatory Visit: Payer: Self-pay

## 2021-10-11 NOTE — Lactation Note (Signed)
This note was copied from a baby's chart.  NICU Lactation Consultation Note  Patient Name: Lauren Garner YEBXI'D Date: 10/11/2021 Age:36 wk.o.   Subjective Reason for consult: Follow-up assessment (observed bf'ing) LC observed infant feed nutritively for 8 minutes. His RR increased to >90 briefly at the end of the feeding and was accompanied by audible panting. He effectively paced himself and came off the breast. Mother continues to pump and is bf'ing twin who was discharged yesterday. Mother used 62m shield for feeding. +milk visible p feeding.   Objective Infant data: Mother's Current Feeding Choice: Breast Milk  Infant feeding assessment Scale for Readiness: 2 Scale for Quality: 2    Maternal data: G2P0203  C-Section, Low Transverse Pumping frequency: q3h + bf'ing twins Pumped volume: 60 mL   Pump: Personal (Spectra)  Assessment Infant: LATCH Score: 10   Maternal: Milk volume: Normal   Intervention/Plan Interventions: Education; Infant Driven Feeding Algorithm education  No data recorded Plan: Consult Status: NICU follow-up  NICU Follow-up type: Assist with IDF-2 (Mother does not need to pre-pump before breastfeeding)    LGwynne Edinger6/04/2022, 6:41 PM

## 2021-10-19 ENCOUNTER — Ambulatory Visit: Payer: Self-pay

## 2021-10-19 NOTE — Lactation Note (Addendum)
This note was copied from a baby's chart.  NICU Lactation Consultation Note  Patient Name: Lauren Garner VACQP'E Date: 10/19/2021 Age:36 wk.o.   Subjective Reason for consult: Follow-up assessment; NICU baby; Multiple gestation  Lactation briefly visited Lauren Garner to check on her. Baby B, Makena, is now at home. She states that she is doing well. I recommended OP support and reviewed OP resources  Lactation will follow up with Lauren Garner again this wee.  Plan: Consult Status: NICU follow-up  No data recorded   Lenore Manner 10/19/2021, 6:17 PM

## 2021-11-01 ENCOUNTER — Inpatient Hospital Stay (HOSPITAL_COMMUNITY): Admit: 2021-11-01 | Payer: Self-pay

## 2022-01-26 ENCOUNTER — Encounter: Payer: Self-pay | Admitting: Internal Medicine

## 2022-01-26 ENCOUNTER — Ambulatory Visit: Payer: BC Managed Care – PPO | Admitting: Internal Medicine

## 2022-01-26 VITALS — BP 96/64 | HR 87 | Temp 97.6°F | Resp 20 | Ht 62.0 in | Wt 158.5 lb

## 2022-01-26 DIAGNOSIS — T782XXA Anaphylactic shock, unspecified, initial encounter: Secondary | ICD-10-CM | POA: Diagnosis not present

## 2022-01-26 DIAGNOSIS — J3089 Other allergic rhinitis: Secondary | ICD-10-CM | POA: Insufficient documentation

## 2022-01-26 DIAGNOSIS — J4551 Severe persistent asthma with (acute) exacerbation: Secondary | ICD-10-CM | POA: Insufficient documentation

## 2022-01-26 MED ORDER — TRELEGY ELLIPTA 200-62.5-25 MCG/ACT IN AEPB: 1.0000 | INHALATION_SPRAY | Freq: Every day | RESPIRATORY_TRACT | 5 refills | Status: AC

## 2022-01-26 MED ORDER — ALBUTEROL SULFATE HFA 108 (90 BASE) MCG/ACT IN AERS: 2.0000 | INHALATION_SPRAY | Freq: Four times a day (QID) | RESPIRATORY_TRACT | 2 refills | Status: AC | PRN

## 2022-01-26 MED ORDER — MONTELUKAST SODIUM 10 MG PO TABS: 10.0000 mg | ORAL_TABLET | ORAL | 5 refills | Status: AC | PRN

## 2022-01-26 MED ORDER — CETIRIZINE HCL 10 MG PO TABS: 10.0000 mg | ORAL_TABLET | Freq: Every day | ORAL | 5 refills | Status: AC

## 2022-01-26 NOTE — Progress Notes (Signed)
New Patient Note  RE: Lauren Garner MRN: [id number] DOB: [birth year] Date of Office Visit: 01/26/2022  Consult requested by: Glenis Smoker, * Primary care provider: Glenis Smoker, MD  Chief Complaint: Asthma (She has had 3 asthma flare ups since last Friday which has given her hoarseness as well. She was diagnosed with asthma 3 years ago.), Food Intolerance (Fresh pineapple makes inside of her mouth itch.), and Allergic Rhinitis  (All year around.)  History of Present Illness: I had the pleasure of seeing Lauren Garner for initial evaluation at the [facility] on 01/26/2022. She is a 36 y.o. female, who is referred here by Glenis Smoker, MD for the evaluation of asthma.  History obtained from patient, chart review.  Asthma:  Diagnosed at age 78 years ago after PNA.  Current symptoms include chest tightness, cough, shortness of breath, and wheezing Previously denied any daytime or nighttime symptoms, but has had daily symptoms over the past week.  Denies nightime symptoms  Using rescue inhaler daily use over the past few days, prior to that not needing it  Limitations to daily activity: some 1 ED visits, 0 UC visits and 0 oral steroids in the past year 1 number of lifetime hospitalizations (2021 for a few days), 0 number of lifetime intubations.  Identified Triggers:  urticaria, heat and respiratory illness Prior PFTs or spirometry: none Previously used therapies: trelegy, symbicort.  Current regimen:  Maintenance: trelegy 24mg 1 puff daily  Rescue: Albuterol 2 puffs q4-6 hrs PRN, not using  prior to exercise  Up-to-date with Covid-19, and Flu, vaccines. History of prior pneumonias: 2021 History of prior COVID-19 infection: 9/22 and 2023 Smoking exposure: denies   Concern for Anaphylaxis:  Food of concern: cocoa butter or almond essential oil, pineapple  History of reaction: developed diffuse urticaria to cocoa butter or  almond oil, presented to the Ed on 10/10/21 and was treated with solumedrol.  Pineapple causes mouth itching  Previous allergy testing no Eats egg, dairy, wheat, soy, fish, shellfish, peanuts, tree nuts, sesame without reactions Carries an epinephrine autoinjector: yes Has food allergy action plan no  Chronic rhinitis: started since a small child  Symptoms include: nasal congestion, rhinorrhea, post nasal drainage, sneezing, watery eyes, itchy eyes, and itchy nose  Occurs year-round with seasonal flares Potential triggers: pollen season, denies animal triggers  Treatments tried: montelukast and zyrtec  Previous allergy testing: no History of reflux/heartburn: yes History of chronic sinusitis or sinus surgery:  NO, treated for acute bacterial sinusitis in July 2023 with antibiotics   and prednisone  Nonallergic triggers:  denies      Assessment and Plan: Lauren Garner a 36y.o. female with: Severe persistent asthma with acute exacerbation - Plan: Spirometry with Graph, Allergy Test, CBC with Differential, Allergens w/Total IgE Area 2  Other allergic rhinitis  Anaphylaxis, initial encounter Plan: Patient Instructions  Severe persistent asthma: Not Well controlled - Breathing test today showed: looked ok, based on your symptoms your asthma is not well controlled -We will get blood work to evaluate for Biologics - For Acute exacerbation start Prednisone '10mg'$  tablet pack: 2 tablets given in office today. Take 2 more tablets before bed today.  Then take 2 tablets twice a day for 2 more days. Then take 2 tablets once a day for 1 day. Then take 1 tablet once a day for 1 day.   PLAN:  - Spacer not needed with current regimen. - Daily controller medication(s):  Trelalgy 2091m  1 puff daily and Singulair '10mg'$  daily - Prior to physical activity: albuterol 2 puffs 10-15 minutes before physical activity. - Rescue medications: albuterol 4 puffs every 4-6 hours as needed - Asthma control goals:   * Full participation in all desired activities (may need albuterol before activity) * Albuterol use two time or less a week on average (not counting use with activity) * Cough interfering with sleep two time or less a month * Oral steroids no more than once a year * No hospitalizations  Perennial allergic rhinitis moderately well: - allergy testing today was intradermal's were positive to molds - allergen avoidance as below - consider allergy shots as long term control of your symptoms by teaching your immune system to be more tolerant of your allergy triggers - Consider Nasal Steroid Spray: Options include Flonase (fluticasone), Nasocort (triamcinolone), Nasonex (mometasome) 1- 2 sprays in each nostril daily (can buy over-the-counter if not covered by insurance)  Best results if used daily. - Continue Singulair (Montelukast) '10mg'$  nightly. - Continue over the counter antihistamine daily or daily as needed.   -Your options include Zyrtec (Cetirizine) '10mg'$ , Claritin (Loratadine) '10mg'$ , Allegra (Fexofenadine) '180mg'$ , or Xyzal (Levocetirinze) '5mg'$   Allergic reactions  -Seen today was negative to almond -Continue to carry EpiPen - for SKIN only reaction, okay to take Benadryl 1 capsules every 6 hours - for SKIN + ANY additional symptoms, OR IF concern for LIFE THREATENING reaction = Epipen Autoinjector EpiPen 0.3 mg. - If using Epinephrine autoinjector, call 911  Follow up: 4 weeks   Control of Mold Allergen   Mold and fungi can grow on a variety of surfaces provided certain temperature and moisture conditions exist.  Outdoor molds grow on plants, decaying vegetation and soil.  The major outdoor mold, Alternaria and Cladosporium, are found in very high numbers during hot and dry conditions.  Generally, a late Summer - Fall peak is seen for common outdoor fungal spores.  Rain will temporarily lower outdoor mold spore count, but counts rise rapidly when the rainy period ends.  The most important  indoor molds are Aspergillus and Penicillium.  Dark, humid and poorly ventilated basements are ideal sites for mold growth.  The next most common sites of mold growth are the bathroom and the kitchen.   Indoor (Perennial) Mold Control   Positive indoor molds via skin testing: Aspergillus, Penicillium, Fusarium, Aureobasidium (Pullulara), and Rhizopus  Maintain humidity below 50%. Clean washable surfaces with 5% bleach solution. Remove sources e.g. contaminated carpets.    Thank you so much for letting me partake in your care today.  Don't hesitate to reach out if you have any additional concerns!  Roney Marion, MD  [facility] [place]    Meds ordered this encounter  Medications   albuterol (VENTOLIN HFA) 108 (90 Base) MCG/ACT inhaler    Sig: Inhale 2 puffs into the lungs every 6 (six) hours as needed for wheezing or shortness of breath.    Dispense:  1 each    Refill:  2   cetirizine (ZYRTEC) 10 MG tablet    Sig: Take 1 tablet (10 mg total) by mouth daily.    Dispense:  32 tablet    Refill:  5   Fluticasone-Umeclidin-Vilant (TRELEGY ELLIPTA) 200-62.5-25 MCG/ACT AEPB    Sig: Inhale 1 puff into the lungs daily.    Dispense:  60 each    Refill:  5   montelukast (SINGULAIR) 10 MG tablet    Sig: Take 1 tablet (10 mg  total) by mouth as needed.    Dispense:  32 tablet    Refill:  5   Lab Orders         CBC with Differential         Allergens w/Total IgE Area 2      Other allergy screening: Asthma: yes Rhino conjunctivitis: yes Food allergy: no Medication allergy: no Hymenoptera allergy: no Urticaria: yes Eczema:no History of recurrent infections suggestive of immunodeficency: no  Diagnostics: Spirometry:  Tracings reviewed. Her effort: Good reproducible efforts. FVC: 2.42 L FEV1: 2.23 L, 89% predicted FEV1/FVC ratio: 87% Interpretation: Spirometry consistent with normal pattern.  Please see scanned spirometry results for details.  Skin  Testing: Environmental allergy panel and select foods. Skin prick was negative to all environmentals and almond, intradermals positive to mold mix 2 and 4  Results interpreted by myself and discussed with patient/family.  Airborne Adult Perc - 01/26/22 0955     Time Antigen Placed 0955    Allergen Manufacturer Lavella Hammock    Location Back    Number of Test 59    1. Control-Buffer 50% Glycerol Negative    2. Control-Histamine 1 mg/ml 4+    3. Albumin saline Negative    4. Delmita Negative    5. [place] Negative    6. Johnson Negative    7. Upham Blue Negative    8. Meadow Fescue Negative    9. Perennial Rye Negative    10. Sweet Vernal Negative    11. Timothy Negative    12. Cocklebur Negative    13. Burweed Marshelder Negative    14. Ragweed, short Negative    15. Ragweed, Giant Negative    16. Plantain,  English Negative    17. Lamb's Quarters Negative    18. Sheep Sorrell Negative    19. Rough Pigweed Negative    20. Marsh Elder, Rough Negative    21. Mugwort, Common Negative    22. Ash mix Negative    23. Birch mix Negative    24. Beech American Negative    25. Box, Elder Negative    26. Cedar, red Negative    27. Cottonwood, [place] Negative    28. Elm mix Negative    29. Hickory Negative    30. Maple mix Negative    31. Oak, [place] mix Negative    32. Pecan Pollen Negative    33. Pine mix Negative    34. Sycamore Eastern Negative    35. Bend, Black Pollen Negative    36. Alternaria alternata Negative    37. Cladosporium Herbarum Negative    38. Aspergillus mix Negative    39. Penicillium mix Negative    40. Bipolaris sorokiniana (Helminthosporium) Negative    41. Drechslera spicifera (Curvularia) Negative    42. Mucor plumbeus Negative    43. Fusarium moniliforme Negative    44. Aureobasidium pullulans (pullulara) Negative    45. Rhizopus oryzae Negative    46. Botrytis cinera Negative    47. Epicoccum nigrum Negative    48. Phoma betae Negative    49.  Candida Albicans Negative    50. Trichophyton mentagrophytes Negative    51. Mite, D Farinae  5,000 AU/ml Negative    52. Mite, D Pteronyssinus  5,000 AU/ml Negative    53. Cat Hair 10,000 BAU/ml Negative    54.  Dog Epithelia Negative    55. Mixed Feathers Negative    56. Horse Epithelia Negative    57. Cockroach, [place] Negative  58. Mouse Negative    59. Tobacco Leaf Negative             Intradermal - 01/26/22 1031     Time Antigen Placed 1031    Allergen Manufacturer Greer    Location Arm    Number of Test 15    Intradermal Select    Control Negative    Guatemala Negative    Johnson Negative    7 Grass Negative    Ragweed mix Negative    Weed mix Negative    Tree mix Negative    Mold 1 Negative    Mold 2 3+    Mold 3 Negative    Mold 4 3+    Cat Negative    Dog Negative    Cockroach Negative    Mite mix Negative             Food Adult Perc - 01/26/22 0900     Time Antigen Placed 1610    Allergen Manufacturer Lavella Hammock    Location Back    Number of allergen test 1    13. 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 1 each 0   etonogestrel (NEXPLANON) 68 MG IMPL implant daily.      ipratropium-albuterol (DUONEB) 0.5-2.5 (3) MG/3ML SOLN      Prenatal Vit-DSS-Fe Fum-FA (PRENATAL 19) tablet Take 1 tablet by mouth daily.     prenatal vitamin w/FE, FA (NATACHEW) 29-1 MG CHEW chewable tablet Chew 1 tablet by mouth daily at 12 noon.     albuterol (VENTOLIN HFA) 108 (90 Base) MCG/ACT inhaler Inhale 2 puffs into the lungs every 6 (six) hours as needed for wheezing or shortness of breath. 1 each 2   cetirizine (ZYRTEC) 10 MG tablet Take 1 tablet (10 mg total) by mouth daily. 32 tablet 5   Fluticasone-Umeclidin-Vilant (TRELEGY ELLIPTA) 200-62.5-25 MCG/ACT AEPB Inhale 1 puff into the lungs daily. 60 each 5   montelukast (SINGULAIR) 10 MG tablet Take 1 tablet (10 mg total) by mouth as needed. 32 tablet 5   No current facility-administered medications for this visit.   Allergies: Allergies  Allergen Reactions   Cocoa Butter Hives   Percocet [Oxycodone-Acetaminophen] Itching    **patient says its the OXY part of the percocet, but she can take tylenol just fine   Vicks Vapo Steam [Camphor-Menthol]    Pineapple Rash   Social History: Social History   Socioeconomic History   Marital status: Married    Spouse name: Not on file   Number of children: Not on file   Years of education: Not on file   Highest education level: Not on file  Occupational History   Not on file  Tobacco Use   Smoking status: Never   Smokeless tobacco: Never  Vaping Use   Vaping Use: Never used  Substance and Sexual Activity   Alcohol use: Not Currently    Comment: not since pregnant   Drug use: Never   Sexual activity: Yes  Other Topics Concern   Not on file  Social History Narrative   Not on file   Social Determinants of Health   Financial Resource Strain: Not on file  Food Insecurity: Not on file  Transportation Needs: Not on file  Physical Activity: Not on file  Stress: Not on file  Social Connections: Not on file   Lives in a single-family home, there are no roaches in the house and  bed is 2 feet off the floor.  There are no dust mite precautions on bed or pillows.  There is no HEPA filter.  [place] do not [place] near an [place] area.. 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   Objective: BP 96/64 (BP Location: Left Arm, Patient Position: Sitting, Cuff Size: Normal)   Pulse 87   Temp 97.6 F (36.4 C) (Temporal)   Resp 20   Ht '5\' 2"'$  (1.575 m)   Wt 158 lb 8 oz (71.9 kg)   SpO2 99%   BMI 28.99 kg/m  Body mass index is 28.99 kg/m.  General Appearance:  Alert, cooperative, no distress, appears stated age  Head:  Normocephalic, without obvious abnormality, atraumatic  Eyes:  Conjunctiva clear, EOM's intact  Nose: Nares normal,  erythematous nasal mucosa, hypertrophic turbinates, no visible anterior polyps, and septum midline  Throat: Lips, tongue normal; teeth and gums normal, no tonsillar exudate and + cobblestoning  Neck: Supple, symmetrical  Lungs:   clear to auscultation bilaterally, Respirations unlabored, intermittent dry coughing  Heart:  regular rate and rhythm and no murmur, Appears well perfused  Extremities: No edema  Skin: Skin color, texture, turgor normal, no rashes or lesions on visualized portions of skin  Neurologic: No gross deficits   The plan was reviewed with the patient/family, and all questions/concerned were addressed.  It was my pleasure to see Lauren Garner today and participate in her care. Please  feel free to contact me with any questions or concerns.  Sincerely,  Roney Marion, MD Allergy & Immunology  [facility] office: [phone] [facility] office: [phone]

## 2022-01-26 NOTE — Patient Instructions (Addendum)
Severe persistent asthma: Not Well controlled - Breathing test today showed: looked ok, based on your symptoms your asthma is not well controlled -We will get blood work to evaluate for Biologics - For Acute exacerbation start Prednisone '10mg'$  tablet pack: 2 tablets given in office today. Take 2 more tablets before bed today.  Then take 2 tablets twice a day for 2 more days. Then take 2 tablets once a day for 1 day. Then take 1 tablet once a day for 1 day.   PLAN:  - Spacer not needed with current regimen. - Daily controller medication(s):  Trelalgy 219mg 1 puff daily and Singulair '10mg'$  daily - Prior to physical activity: albuterol 2 puffs 10-15 minutes before physical activity. - Rescue medications: albuterol 4 puffs every 4-6 hours as needed - Asthma control goals:  * Full participation in all desired activities (may need albuterol before activity) * Albuterol use two time or less a week on average (not counting use with activity) * Cough interfering with sleep two time or less a month * Oral steroids no more than once a year * No hospitalizations  Perennial allergic rhinitis moderately well: - allergy testing today was intradermal's were positive to molds - allergen avoidance as below - consider allergy shots as long term control of your symptoms by teaching your immune system to be more tolerant of your allergy triggers - Consider Nasal Steroid Spray: Options include Flonase (fluticasone), Nasocort (triamcinolone), Nasonex (mometasome) 1- 2 sprays in each nostril daily (can buy over-the-counter if not covered by insurance)  Best results if used daily. - Continue Singulair (Montelukast) '10mg'$  nightly. - Continue over the counter antihistamine daily or daily as needed.   -Your options include Zyrtec (Cetirizine) '10mg'$ , Claritin (Loratadine) '10mg'$ , Allegra (Fexofenadine) '180mg'$ , or Xyzal (Levocetirinze) '5mg'$   Allergic reactions  -Seen today was negative to almond -Continue to carry  EpiPen - for SKIN only reaction, okay to take Benadryl 1 capsules every 6 hours - for SKIN + ANY additional symptoms, OR IF concern for LIFE THREATENING reaction = Epipen Autoinjector EpiPen 0.3 mg. - If using Epinephrine autoinjector, call 911  Follow up: 4 weeks   Control of Mold Allergen   Mold and fungi can grow on a variety of surfaces provided certain temperature and moisture conditions exist.  Outdoor molds grow on plants, decaying vegetation and soil.  The major outdoor mold, Alternaria and Cladosporium, are found in very high numbers during hot and dry conditions.  Generally, a late Summer - Fall peak is seen for common outdoor fungal spores.  Rain will temporarily lower outdoor mold spore count, but counts rise rapidly when the rainy period ends.  The most important indoor molds are Aspergillus and Penicillium.  Dark, humid and poorly ventilated basements are ideal sites for mold growth.  The next most common sites of mold growth are the bathroom and the kitchen.   Indoor (Perennial) Mold Control   Positive indoor molds via skin testing: Aspergillus, Penicillium, Fusarium, Aureobasidium (Pullulara), and Rhizopus  Maintain humidity below 50%. Clean washable surfaces with 5% bleach solution. Remove sources e.g. contaminated carpets.    Thank you so much for letting me partake in your care today.  Don't hesitate to reach out if you have any additional concerns!  ERoney Marion MD  [facility] [place]

## 2022-07-06 ENCOUNTER — Ambulatory Visit (HOSPITAL_COMMUNITY)
Admission: EM | Admit: 2022-07-06 | Discharge: 2022-07-06 | Disposition: A | Discharge status: Home / Self Care | Payer: BC Managed Care – PPO | Attending: Family Medicine | Admitting: Family Medicine

## 2022-07-06 ENCOUNTER — Encounter (HOSPITAL_COMMUNITY): Payer: Self-pay | Admitting: Emergency Medicine

## 2022-07-06 DIAGNOSIS — J4521 Mild intermittent asthma with (acute) exacerbation: Secondary | ICD-10-CM | POA: Diagnosis not present

## 2022-07-06 DIAGNOSIS — R0981 Nasal congestion: Secondary | ICD-10-CM | POA: Diagnosis not present

## 2022-07-06 DIAGNOSIS — R051 Acute cough: Secondary | ICD-10-CM

## 2022-07-06 MED ORDER — PREDNISONE 20 MG PO TABS: 40.0000 mg | ORAL_TABLET | Freq: Every day | ORAL | 0 refills | Status: DC

## 2022-07-06 NOTE — ED Triage Notes (Signed)
Pt reports c/o congestion for a month-Sudafed no longer working. Today started having postnasal drip, sore throat, itchy eyes, repots has asthma and feels SOB. Using asthma inhaler that was temporary relief and allergy medications

## 2022-07-09 NOTE — ED Provider Notes (Signed)
[facility]   [id number] 07/06/22 Arrival Time: [id number]  ASSESSMENT & PLAN:  1. Mild intermittent asthma with acute exacerbation   2. Acute cough   3. Nasal congestion    Likely viral trigger. No respiratory distress. Has alb at home to use prn.  Meds ordered this encounter  Medications   predniSONE (DELTASONE) 20 MG tablet    Sig: Take 2 tablets (40 mg total) by mouth daily.    Dispense:  10 tablet    Refill:  0   Asthma precautions given. OTC symptom care as needed.  Recommend:  Follow-up Information     Glenis Smoker, MD.   Specialty: Family Medicine Why: If worsening or failing to improve as anticipated. Contact information: [address] [place] [address] [phone]                 Reviewed expectations re: course of current medical issues. Questions answered. Outlined signs and symptoms indicating need for more acute intervention. Patient verbalized understanding. After Visit Summary given.  SUBJECTIVE: History from: patient.  Lauren Garner is a 37 y.o. female who presents with complaint of cough, congestion, wheezing at times; sick last month then better. This illness x a few days. Denies fever. No current SOB. Denies CP. Typically asthma is controlled. Albuterol inhaler does help.   Social History   Tobacco Use  Smoking Status Never  Smokeless Tobacco Never    OBJECTIVE:  Vitals:   07/06/22 1710  BP: 119/80  Pulse: 80  Resp: 17  Temp: 97.9 F (36.6 C)  TempSrc: Oral  SpO2: 99%     General appearance: alert; NAD HEENT: [place]; AT; with nasal congestion Neck: supple without LAD CV: RRR without murmer Lungs: unlabored respirations, moderate bilateral expiratory wheezing; cough: mild; no significant respiratory distress Skin: warm and dry Psychological: alert and cooperative; normal mood and affect    Allergies  Allergen Reactions   Cocoa Butter Hives   Percocet [Oxycodone-Acetaminophen]  Itching    **patient says its the OXY part of the percocet, but she can take tylenol just fine   Vicks Vapo Steam [Camphor-Menthol]    Pineapple Rash    Past Medical History:  Diagnosis Date   Asthma    Family History  Problem Relation Age of Onset   Cancer Mother    Hypertension Mother    Hyperlipidemia Mother    Allergic rhinitis Father    Cancer Father    Hyperlipidemia Father    Angioedema Neg Hx    Asthma Neg Hx    Urticaria Neg Hx    Immunodeficiency Neg Hx    Eczema Neg Hx    Social History   Socioeconomic History   Marital status: Married    Spouse name: Not on file   Number of children: Not on file   Years of education: Not on file   Highest education level: Not on file  Occupational History   Not on file  Tobacco Use   Smoking status: Never   Smokeless tobacco: Never  Vaping Use   Vaping Use: Never used  Substance and Sexual Activity   Alcohol use: Not Currently    Comment: not since pregnant   Drug use: Never   Sexual activity: Yes  Other Topics Concern   Not on file  Social History Narrative   Not on file   Social Determinants of Health   Financial Resource Strain: Not on file  Food Insecurity: Not on file  Transportation Needs: Not on file  Physical Activity: Not on file  Stress: Not on file  Social Connections: Not on file  Intimate Partner Violence: Not on file             Vanessa Kick, MD 07/09/22 1040

## 2022-10-03 IMAGING — US USMFM FETAL BPP W/O NON-STRESS ADDL GEST
1 series · 14 of 28 positions shown · non-contrast
Comparison: none

[Series 1: usmfm fetal bpp w/o non-stress addl gest · 53 acquisitions, 14 frames shown]
[im 2/53]
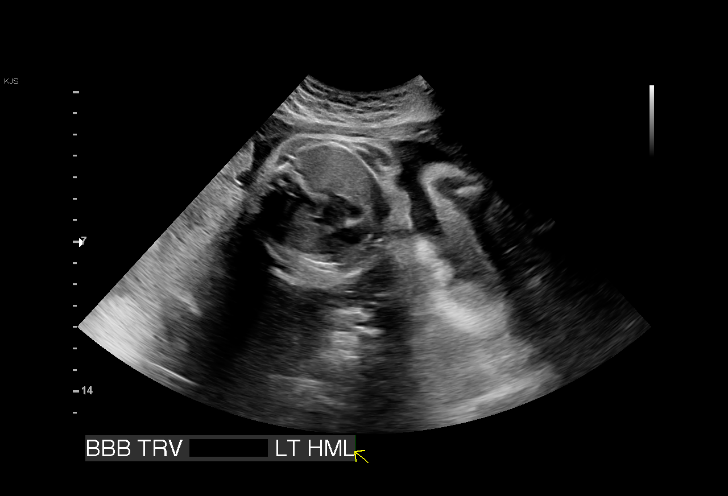
[im 6/53]
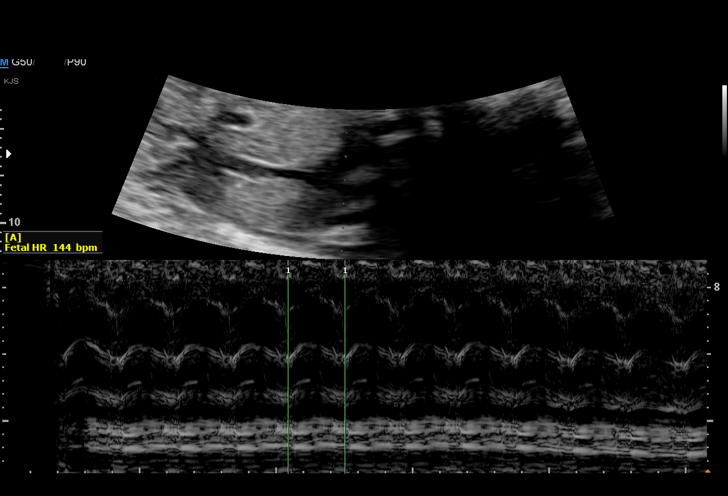
[im 10/53]
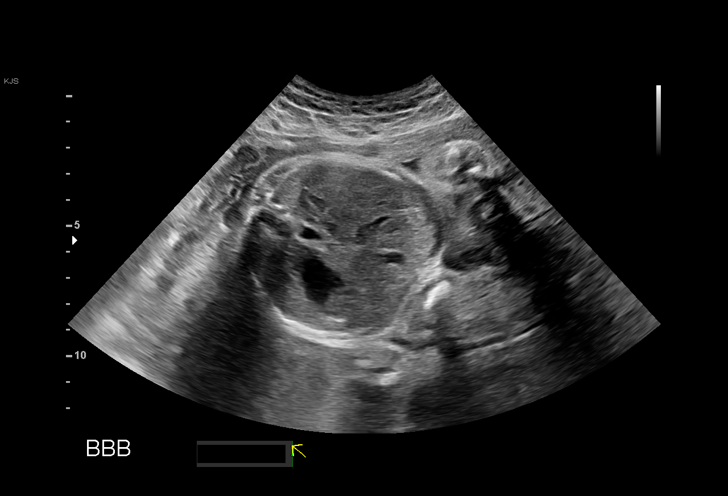
[im 14/53]
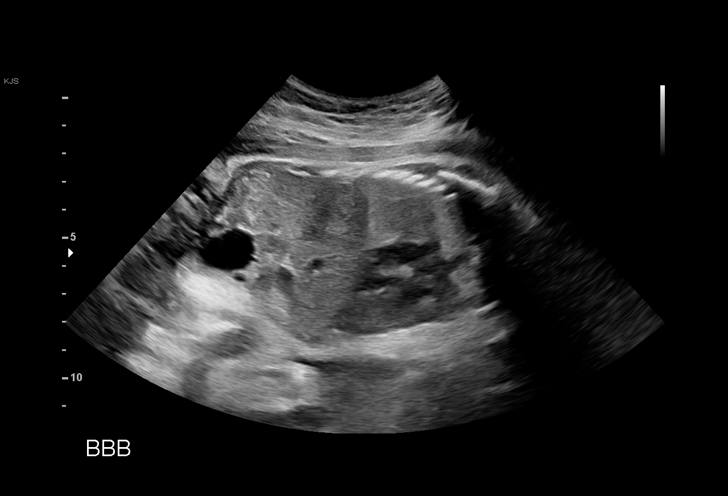
[im 18/53]
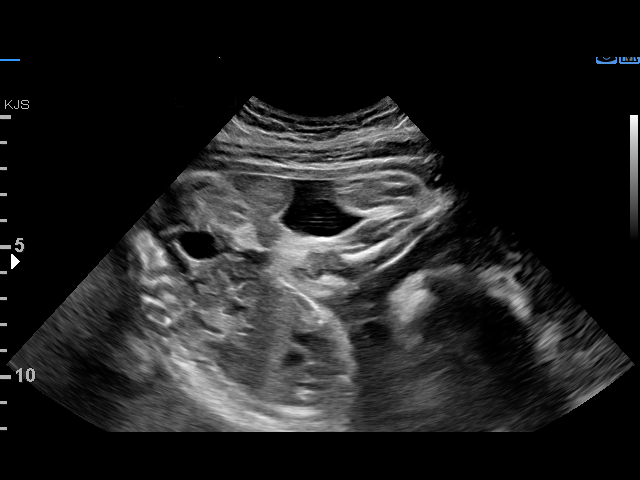
[im 22/53]
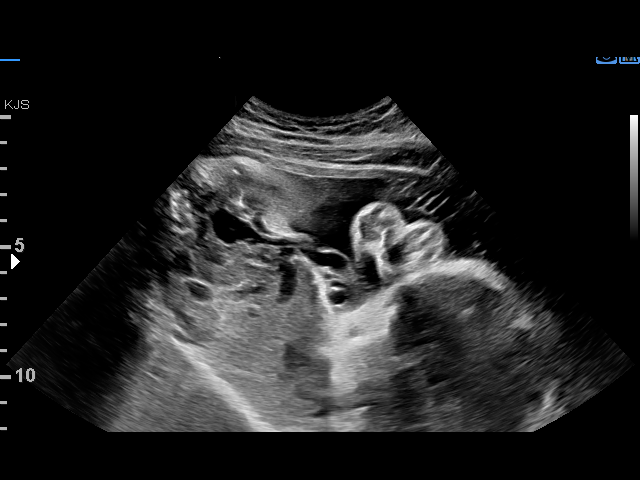
[im 26/53]
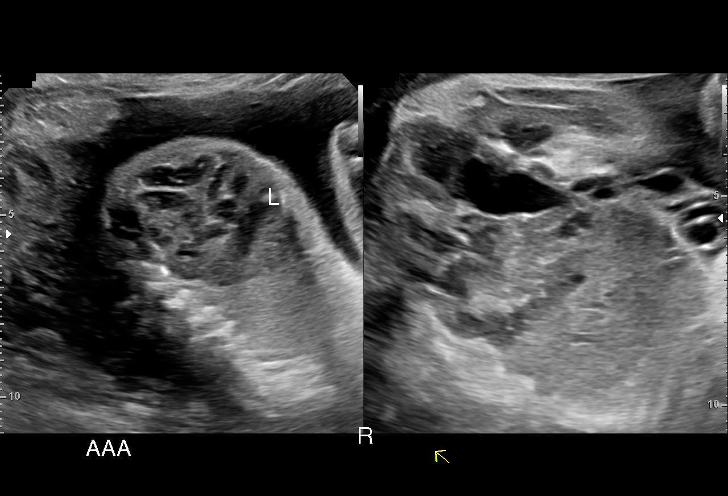
[im 29/53]
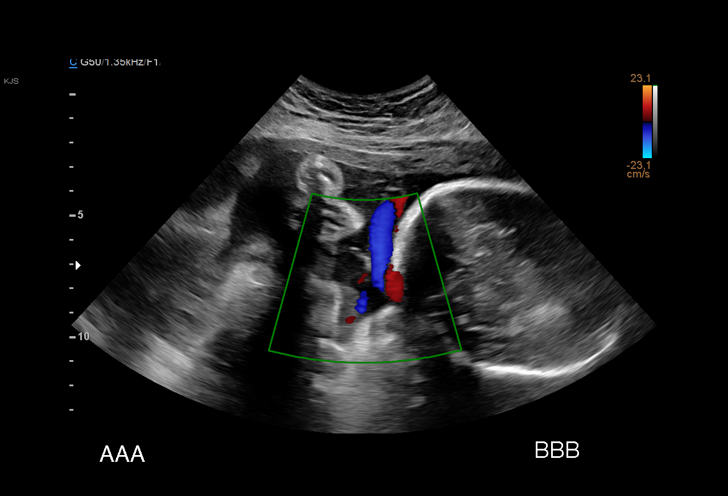
[im 33/53]
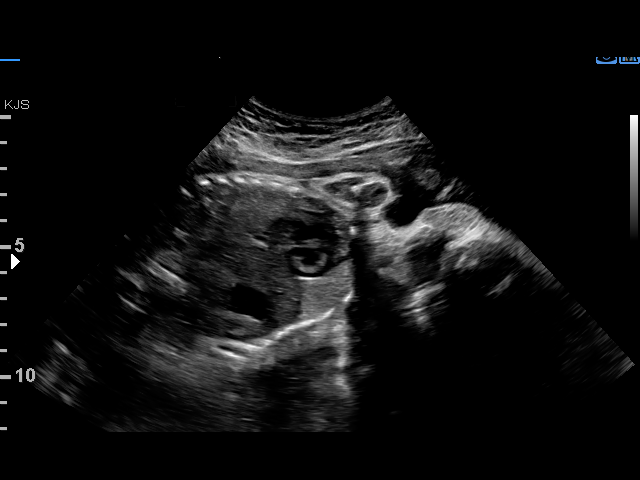
[im 37/53]
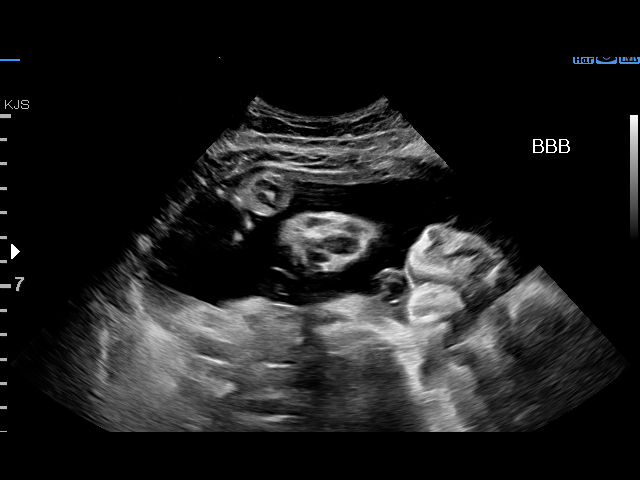
[im 41/53]
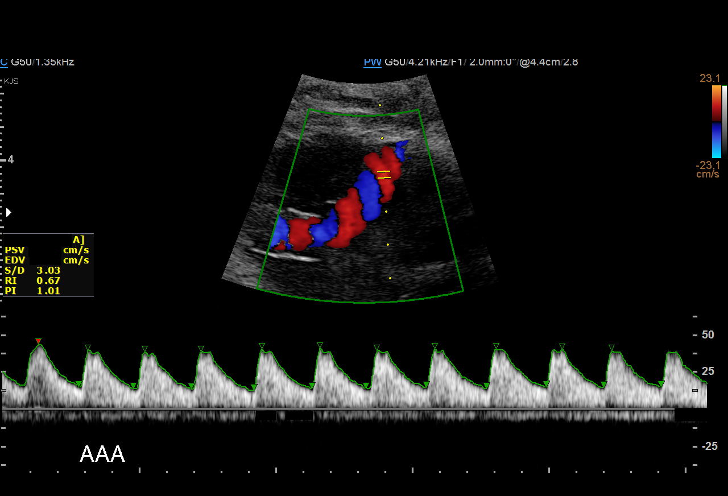
[im 45/53]
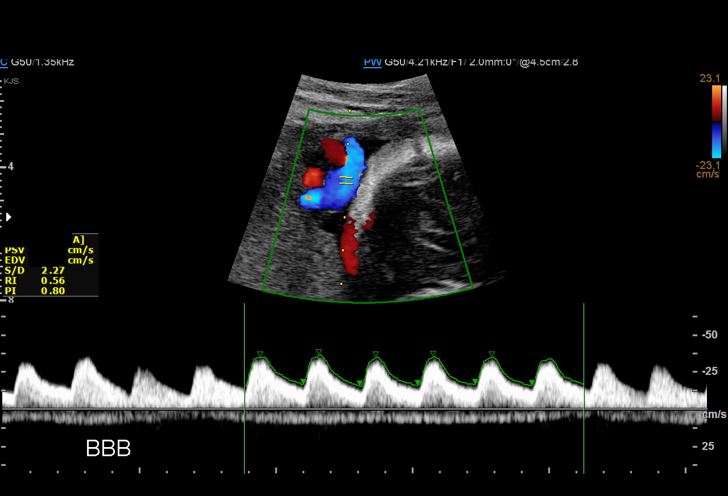
[im 49/53]
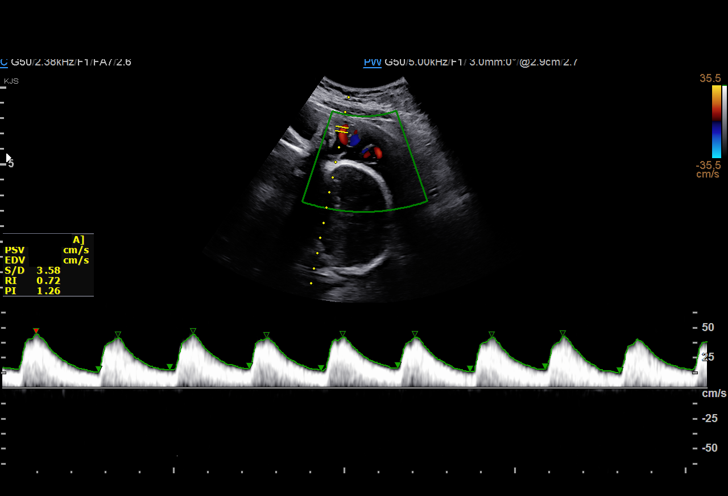
[im 53/53]
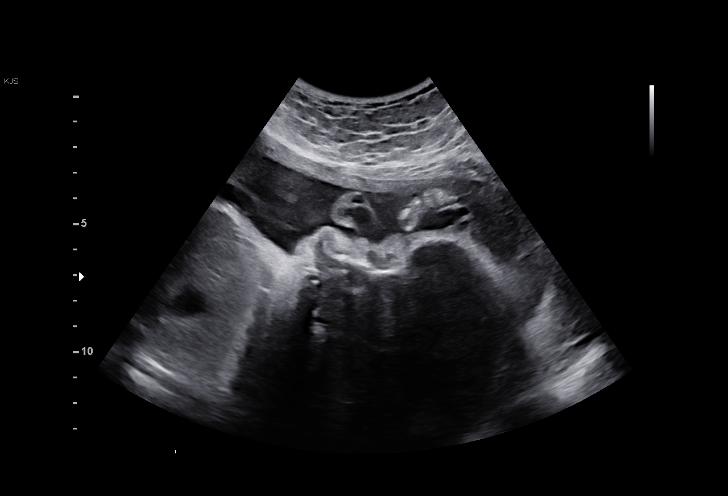

[14 of 28 positions shown; findings below may reference images not displayed]

[REDACTED]
                                                            Ave[address] [HOSPITAL]

    ADDL GESTATION
    GEST RE EVAL

Indications

 Maternal care for known or suspected poor
 fetal growth, third trimester, fetus 2 IUGR
 Twin pregnancy, di/di, third trimester
 Advanced maternal age multigravida 35+,
 third trimester
 Previous cesarean delivery, antepartum
 Poor obstetric history: Previous preterm
 delivery, antepartum (17wks)
 32 weeks gestation of pregnancy
 Low risk NIPS
Vital Signs

                                                Height:        5'1"
Fetal Evaluation (Fetus A)
 Num Of Fetuses:         2
 Fetal Heart Rate(bpm):  144
 Cardiac Activity:       Observed
 Fetal Lie:              Lower Right
 Presentation:           Cephalic
 Placenta:               Posterior
 P. Cord Insertion:      Previously Visualized

 Amniotic Fluid
 AFI FV:      Within normal limits

                             Largest Pocket(cm)

Biophysical Evaluation (Fetus A)

 Amniotic F.V:   Pocket => 2 cm             F. Tone:        Observed
 F. Movement:    Observed                   Score:          [DATE]
 F. Breathing:   Observed
OB History

 Gravidity:    2         Term:   0        Prem:   1        SAB:   0
 TOP:          0       Ectopic:  0        Living: 1
Gestational Age (Fetus A)

 LMP:           32w 1d        Date:  01/25/21                 EDD:   11/01/21
 Best:          32w 1d     Det. By:  LMP  (01/25/21)          EDD:   11/01/21
Doppler - Fetal Vessels (Fetus A)

 Umbilical Artery
  S/D     %tile      RI    %tile      PI    %tile            ADFV    RDFV
  3.02       71    0.67       75    1.04       76               No      No

Fetal Evaluation (Fetus B)

 Num Of Fetuses:         2
 Fetal Heart Rate(bpm):  145
 Cardiac Activity:       Observed
 Fetal Lie:              Upper Left
 Presentation:           Transverse, head to maternal left
 Placenta:               Posterior
 P. Cord Insertion:      Previously Visualized

 Amniotic Fluid
 AFI FV:      Within normal limits

                             Largest Pocket(cm)

Biophysical Evaluation (Fetus B)
 Amniotic F.V:   Pocket => 2 cm             F. Tone:        Observed
 F. Movement:    Observed                   Score:          [DATE]
 F. Breathing:   Observed
Gestational Age (Fetus B)

 LMP:           32w 1d        Date:  01/25/21                 EDD:   11/01/21
 Best:          32w 1d     Det. By:  LMP  (01/25/21)          EDD:   11/01/21
Doppler - Fetal Vessels (Fetus B)

 Umbilical Artery
  S/D     %tile      RI    %tile      PI    %tile            ADFV    RDFV
  2.49       39     0.6       46    0.89       48               No      No

Comments

 Poncho Jerome was seen due to fetal growth
 restriction of both fetuses in a dichorionic, diamniotic twin
 gestation.  She denies any problems since her last exam.
 She reports feeling vigorous fetal movements of both fetuses
 throughout the day.
 A BPP performed today was [DATE] for both twin A and
 twin B.
 There was normal amniotic fluid noted on today's exam
 around both twin A and twin B.
 Doppler studies of the umbilical arteries performed due to
 fetal growth restriction showed continued forward flow in both
 twin A and twin B.  There were no signs of absent or reversed
 end-diastolic flow noted today in either fetus.
 She will return in 1 week for another BPP and umbilical artery
 Doppler study.

 She already has a repeat cesarean delivery scheduled on
 September 24, 2021, when she will be 34 weeks and 5 days.

 We will plan on administering a complete course of antenatal
 corticosteroids prior to delivery.

## 2022-10-16 IMAGING — US US MFM FETAL BPP W/O NON-STRESS
1 series · 14 of 28 positions shown · non-contrast
Comparison: none

[Series 1: us mfm fetal bpp w/o non-stress · 63 acquisitions, 14 frames shown]
[im 3/63]
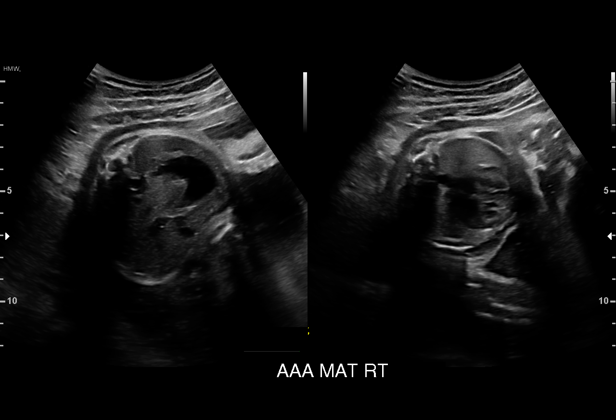
[im 7/63]
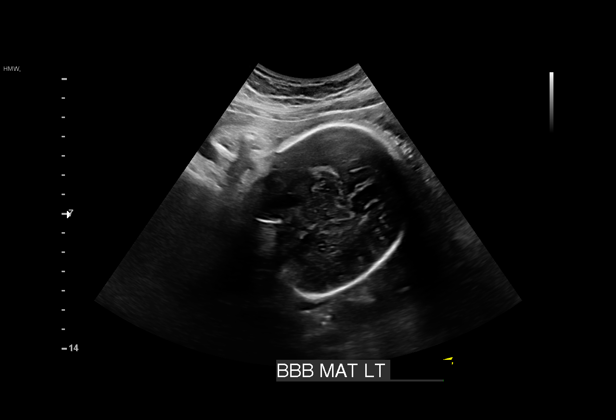
[im 12/63]
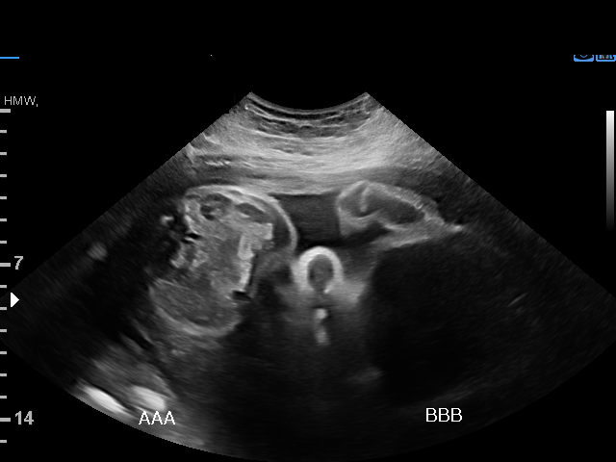
[im 17/63]
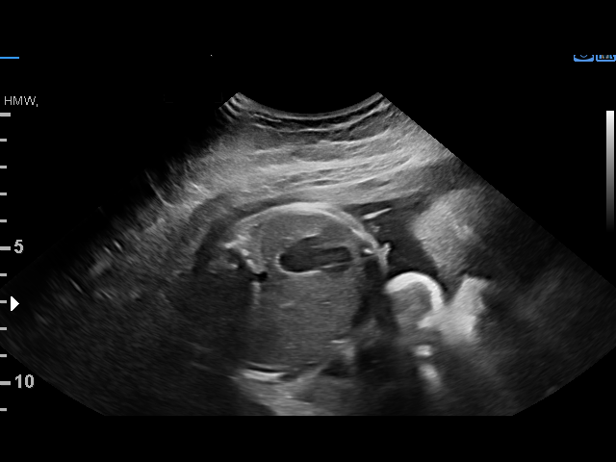
[im 21/63]
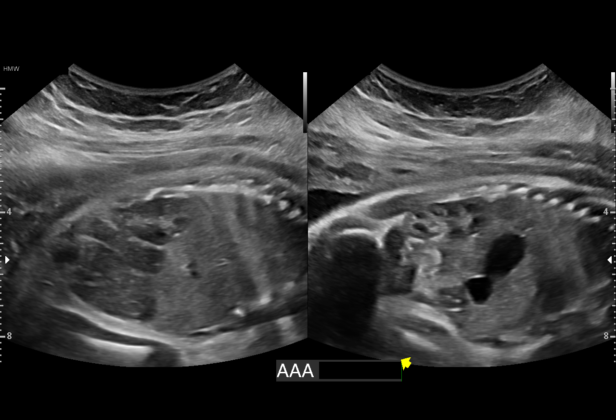
[im 26/63]
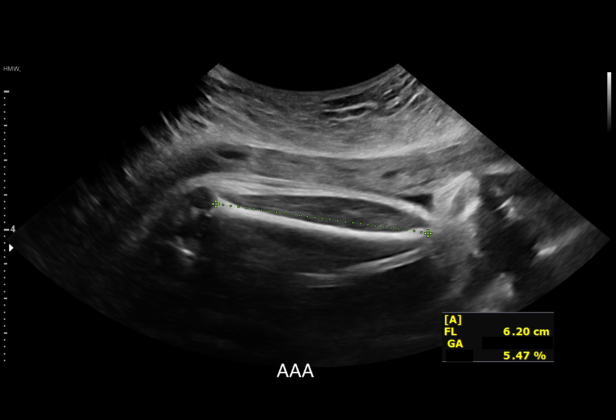
[im 30/63]
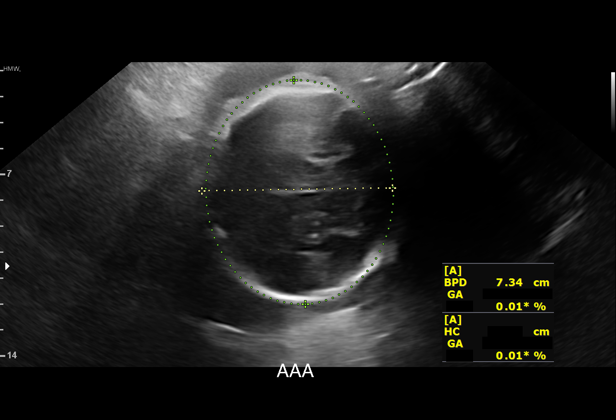
[im 35/63]
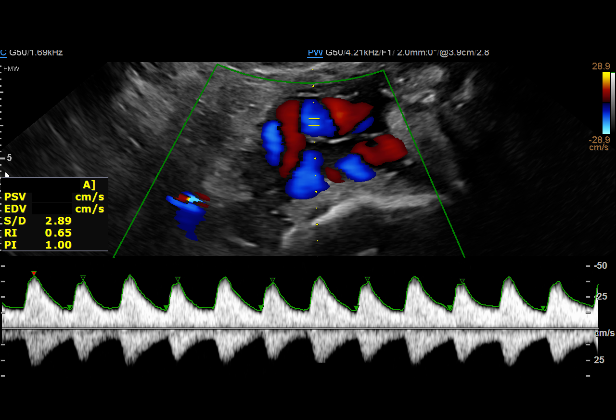
[im 40/63]
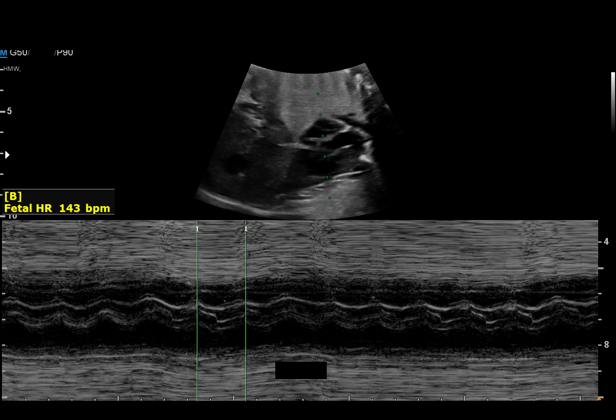
[im 44/63]
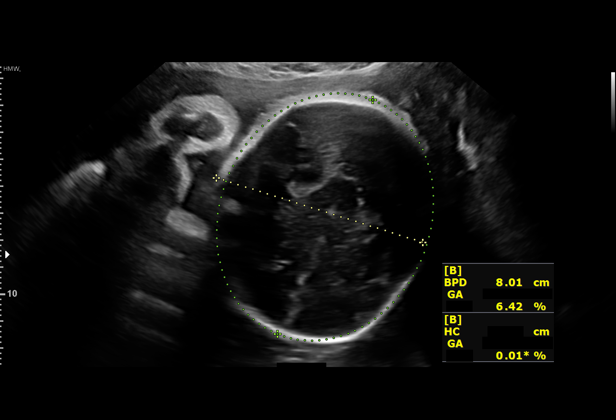
[im 49/63]
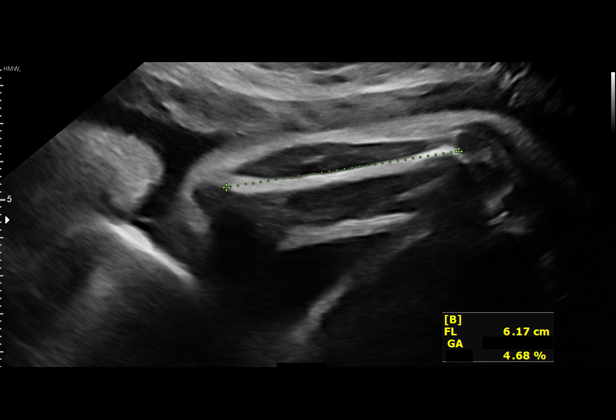
[im 53/63]
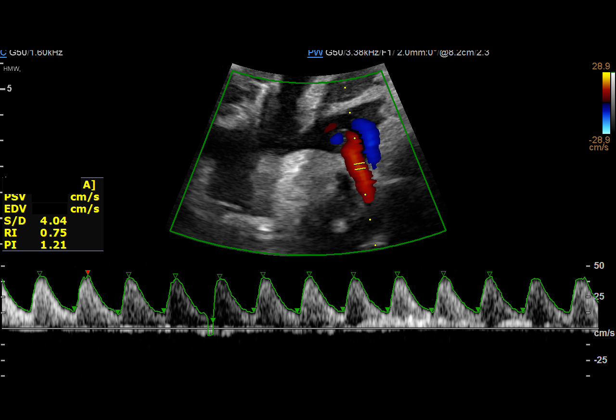
[im 58/63]
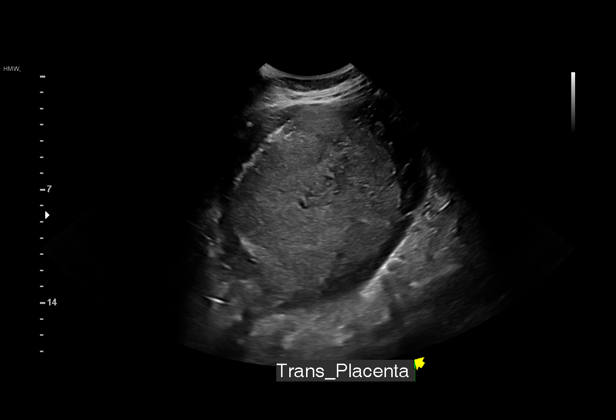
[im 63/63]
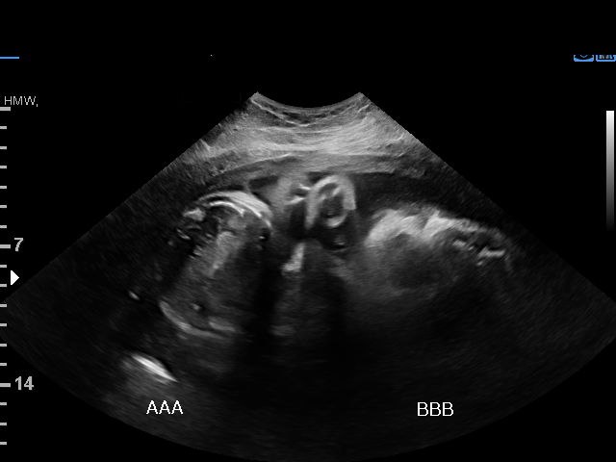

[14 of 28 positions shown; findings below may reference images not displayed]

Obstetrics &
                                                            Gynecology
                                                            [address]
                                                            [place]
                                                            Care
                   [facility]                               [HOSPITAL]

    ADDL GESTATION
    GEST
 5  US MFM UA CORD DOPPLER                76820.02    JINIAN ARAIN
 6  US MFM UA DOPPLER ADDL                76820.03    JINIAN ARAIN
    HIPI ENIS RE EVAL

Indications

 Maternal care for known or suspected poor
 fetal growth, third trimester, fetus 2 IUGR
 Twin pregnancy, di/di, third trimester
 Premature rupture of membranes - leaking
 fluid (+ Amnisure 09-12-21)
 Advanced maternal age multigravida 35+,
 third trimester
 Previous cesarean delivery, antepartum
 Poor obstetric history: Previous preterm
 delivery, antepartum (92wks)
 Low risk NIPS
 34 weeks gestation of pregnancy
Vital Signs

                                                Height:        5'1"
Fetal Evaluation (Fetus A)

 Num Of Fetuses:         2
 Fetal Heart Rate(bpm):  152
 Cardiac Activity:       Observed
 Fetal Lie:              Maternal right side
 Presentation:           Cephalic
 Placenta:               Posterior
 P. Cord Insertion:      Previously Visualized
 Membrane Desc:      Dividing Membrane seen

 Amniotic Fluid
 AFI FV:      Oligohydramnios

                             Largest Pocket(cm)

Biophysical Evaluation (Fetus A)

 Amniotic F.V:   Pocket < 2 cm two          F. Tone:        Observed
                 planes
 F. Movement:    Observed                   Score:          [DATE]
 F. Breathing:   Observed
Biometry (Fetus A)

 BPD:      73.5  mm     G. Age:  29w 3d        < 1  %    CI:        84.97   %    70 - 86
                                                         FL/HC:      24.5   %    19.4 -
 HC:      251.2  mm     G. Age:  27w 2d        < 1  %    HC/AC:      1.07        0.96 -
 AC:      234.4  mm     G. Age:  27w 5d        < 1  %    FL/BPD:     83.7   %    71 - 87
 FL:       61.5  mm     G. Age:  31w 6d        3.9  %    FL/AC:      26.2   %    20 - 24

 Est. FW:    5400  gm           3 lb    < 1  %     FW Discordancy        18  %
OB History

 Gravidity:    2         Term:   0        Prem:   1        SAB:   0
 TOP:          0       Ectopic:  0        Living: 1
Gestational Age (Fetus A)

 LMP:           34w 0d        Date:  01/25/21                 EDD:   11/01/21
 U/S Today:     29w 1d                                        EDD:   12/05/21
 Best:          34w 0d     Det. By:  LMP  (01/25/21)          EDD:   11/01/21
Anatomy (Fetus A)

 Cranium:               Previously seen        LVOT:                   Previously seen
 Cavum:                 Previously seen        Aortic Arch:            Previously seen
 Ventricles:            Previously seen        Ductal Arch:            Previously seen
 Choroid Plexus:        Previously seen        Diaphragm:              Previously seen
 Cerebellum:            Previously seen        Stomach:                Appears normal, left
                                                                       sided
 Posterior Fossa:       Previously seen        Abdomen:                Previously seen
 Nuchal Fold:           Not applicable (>20    Abdominal Wall:         Previously seen
                        wks GA)
 Face:                  Orbits and profile     Cord Vessels:           Previously seen
                        previously seen
 Lips:                  Previously seen        Kidneys:                Appear normal
 Palate:                Previously seen        Bladder:                Appears normal
 Thoracic:              Previously seen        Spine:                  Previously seen
 Heart:                 Appears normal         Upper Extremities:      Previously seen
                        (4CH, axis, and
                        situs)
 RVOT:                  Previously seen        Lower Extremities:      Previously seen

 Other:  Fetus appears to be a male. Heels, VC and nasal bone prev.
         visualized. 3VV and 3VTV prev visualized. Technically difficult due to
         fetal position.
Doppler - Fetal Vessels (Fetus A)

 Umbilical Artery
  S/D     %tile      RI    %tile      PI    %tile            ADFV    RDFV
  2.77       64    0.64       71    0.99       75               No      No

Fetal Evaluation (Fetus B)

 Num Of Fetuses:         2
 Fetal Heart Rate(bpm):  143
 Cardiac Activity:       Observed
 Fetal Lie:              Maternal left side
 Presentation:           Cephalic
 Placenta:               Posterior
 P. Cord Insertion:      Previously Visualized
 Membrane Desc:      Dividing Membrane seen

 Amniotic Fluid
 AFI FV:      Within normal limits

                             Largest Pocket(cm)

Biophysical Evaluation (Fetus B)

 Amniotic F.V:   Within normal limits       F. Tone:        Observed
 F. Movement:    Observed                   Score:          [DATE]
 F. Breathing:   Observed
Biometry (Fetus B)

 BPD:      79.9  mm     G. Age:  32w 1d          6  %    CI:        85.48   %    70 - 86
                                                         FL/HC:      22.6   %    19.4 -
 HC:      272.2  mm     G. Age:  29w 5d        < 1  %    HC/AC:      1.04        0.96 -
 AC:      261.1  mm     G. Age:  30w 2d        < 1  %    FL/BPD:     76.8   %    71 - 87
 FL:       61.4  mm     G. Age:  31w 6d        3.6  %    FL/AC:      23.5   %    20 - 24

 Est. FW:    2832  gm    3 lb 10 oz     < 1  %     FW Discordancy     0 \ 18 %
Gestational Age (Fetus B)

 LMP:           34w 0d        Date:  01/25/21                 EDD:   11/01/21
 U/S Today:     31w 0d                                        EDD:   11/22/21
 Best:          34w 0d     Det. By:  LMP  (01/25/21)          EDD:   11/01/21
Anatomy (Fetus B)

 Cranium:               Previously seen        Aortic Arch:            Previously seen
 Cavum:                 Previously seen        Ductal Arch:            Previously seen
 Ventricles:            Appears normal         Diaphragm:              Previously seen
 Choroid Plexus:        Previously seen        Stomach:                Appears normal, left
                                                                       sided
 Cerebellum:            Previously seen        Abdomen:                Previously seen
 Posterior Fossa:       Previously seen        Abdominal Wall:         Previously seen
 Nuchal Fold:           Not applicable (>20    Cord Vessels:           Previously seen
                        wks GA)
 Face:                  Absent nasal bone      Kidneys:                Appear normal
                        prev seen
 Lips:                  Previously seen        Bladder:                Appears normal
 Thoracic:              Previously seen        Spine:                  Previously seen
 Heart:                 Appears normal         Upper Extremities:      Appears normal
                        (4CH, axis, and
                        situs)
 RVOT:                  Previously seen        Lower Extremities:      Previously seen
 LVOT:                  Previously seen

 Other:  Fetus appears to be female. Lenses, heels/feet, LEFT open hand,
         3VV, 3VT prev visualized
Doppler - Fetal Vessels (Fetus B)

 Umbilical Artery
  S/D     %tile      RI    %tile      PI    %tile            ADFV    RDFV
  3.94       97    0.75       97    1.24       96               No      No

Cervix Uterus Adnexa

 Cervix
 Not visualized (advanced GA >71wks)

 Uterus
 No abnormality visualized.

 Right Ovary
 No adnexal mass visualized.

 Left Ovary
 No adnexal mass visualized.

 Cul De Sac
 No free fluid seen.

 Adnexa
 No abnormality visualized.
Comments

 Michiko Hamel was seen due to fetal growth
 restriction of both fetuses in a dichorionic, diamniotic twin
 gestation.  She has been hospitalized due to PPROM in twin
 A.  Her delivery has been delayed as the NICU is currently
 closed.
 A BPP performed today was [DATE] for twin A.  Twin A
 received a -2 for an absent 2 x 2 centimeter pocket of
 amniotic fluid.
 A BPP performed today was [DATE] for twin B.  There was
 normal amniotic fluid around twin B.
 The patient has had daily reactive NSTs for both twin A and
 twin B while she has been hospitalized.
 The overall EFW for twin A today was 3 pounds, which is less
 than the 1st percentile for her gestational age.  Twin A has
 grown about 1 pound over the last 19 days.
 The overall EFW for twin B today was 3 pounds 10 ounces,
 which is at less than the 1st percentile for her gestational
 age.  Twin B has grown 10 ounces over the last 19 days.
 Doppler studies of the umbilical arteries performed due to
 fetal growth restriction showed continued forward flow in both
 twin A and twin B.  There were no signs of absent or reversed
 end-diastolic flow noted today in either fetus.

 The patient will be delivered via repeat C-section once the
 NICU has room, which hopefully will be within the next 2 days.
 She should continue daily fetal testing until delivery.

## 2023-07-12 ENCOUNTER — Other Ambulatory Visit: Payer: Self-pay | Admitting: Family Medicine

## 2023-07-12 DIAGNOSIS — N644 Mastodynia: Secondary | ICD-10-CM

## 2023-07-16 ENCOUNTER — Emergency Department (HOSPITAL_COMMUNITY)
Admission: EM | Admit: 2023-07-16 | Discharge: 2023-07-16 | Disposition: A | Discharge status: Home / Self Care | Attending: Emergency Medicine | Admitting: Emergency Medicine

## 2023-07-16 ENCOUNTER — Emergency Department (HOSPITAL_COMMUNITY)

## 2023-07-16 DIAGNOSIS — Z79899 Other long term (current) drug therapy: Secondary | ICD-10-CM | POA: Diagnosis not present

## 2023-07-16 DIAGNOSIS — J45909 Unspecified asthma, uncomplicated: Secondary | ICD-10-CM | POA: Diagnosis not present

## 2023-07-16 DIAGNOSIS — M542 Cervicalgia: Secondary | ICD-10-CM | POA: Diagnosis present

## 2023-07-16 LAB — BASIC METABOLIC PANEL
Anion gap: 7 (ref 5–15)
BUN: 11 mg/dL (ref 6–20)
CO2: 24 mmol/L (ref 22–32)
Calcium: 9 mg/dL (ref 8.9–10.3)
Chloride: 107 mmol/L (ref 98–111)
Creatinine, Ser: 0.74 mg/dL (ref 0.44–1.00)
GFR, Estimated: >60 mL/min (ref >60)
Glucose, Bld: 99 mg/dL (ref 70–99)
Potassium: 3.6 mmol/L (ref 3.5–5.1)
Sodium: 138 mmol/L (ref 135–145)

## 2023-07-16 LAB — CBC WITH DIFFERENTIAL/PLATELET
Abs Immature Granulocytes: 0.02 10*3/uL (ref 0.00–0.07)
Basophils Absolute: 0.1 10*3/uL (ref 0.0–0.1)
Basophils Relative: 1 %
Eosinophils Absolute: 0.2 10*3/uL (ref 0.0–0.5)
Eosinophils Relative: 3 %
HCT: 43.6 % (ref 36.0–46.0)
Hemoglobin: 13.6 g/dL (ref 12.0–15.0)
Immature Granulocytes: 0 %
Lymphocytes Relative: 41 %
Lymphs Abs: 3.7 10*3/uL (ref 0.7–4.0)
MCH: 30 pg (ref 26.0–34.0)
MCHC: 31.2 g/dL (ref 30.0–36.0)
MCV: 96.2 fL (ref 80.0–100.0)
Monocytes Absolute: 0.5 10*3/uL (ref 0.1–1.0)
Monocytes Relative: 6 %
Neutro Abs: 4.5 10*3/uL (ref 1.7–7.7)
Neutrophils Relative %: 49 %
Platelets: 278 10*3/uL (ref 150–400)
RBC: 4.53 MIL/uL (ref 3.87–5.11)
RDW: 13.1 % (ref 11.5–15.5)
WBC: 9 10*3/uL (ref 4.0–10.5)
nRBC: 0 % (ref 0.0–0.2)

## 2023-07-16 MED ORDER — GABAPENTIN 300 MG PO CAPS: 300.0000 mg | ORAL_CAPSULE | Freq: Three times a day (TID) | ORAL | 0 refills | Status: DC

## 2023-07-16 MED ORDER — NAPROXEN 500 MG PO TABS: 500.0000 mg | ORAL_TABLET | Freq: Two times a day (BID) | ORAL | 0 refills | Status: DC

## 2023-07-16 NOTE — ED Provider Notes (Signed)
 [place] EMERGENCY DEPARTMENT AT [facility] Provider Note   CSN: [id number] Arrival date & time: 07/16/23  1518     History  Chief Complaint  Patient presents with   Neck Pain    Davion Meara is a 38 y.o. female.   Neck Pain Patient is a 38 year old female presents ED today complaining of left-sided cervical pain and numbness that extends down left arm to midway down brachial region.  States she noticed it whenever she entered into her car yesterday and tried to look to her left and noticed that she was having increased pain. Pain has since been increasing and has been accompanied by paresthesias. Has not felt this sensation before.  Denies any trauma to the area, weakness, strength decrease.     Home Medications Prior to Admission medications   Medication Sig Start Date End Date Taking? Authorizing Provider  gabapentin (NEURONTIN) 300 MG capsule Take 1 capsule (300 mg total) by mouth 3 (three) times daily for 10 days. 07/16/23 07/26/23 Yes Lunette Stands, PA-C  naproxen (NAPROSYN) 500 MG tablet Take 1 tablet (500 mg total) by mouth 2 (two) times daily. 07/16/23  Yes Lunette Stands, PA-C  albuterol (VENTOLIN HFA) 108 (90 Base) MCG/ACT inhaler Inhale 2 puffs into the lungs every 6 (six) hours as needed for wheezing or shortness of breath. 01/26/22   Ferol Luz, MD  cetirizine (ZYRTEC) 10 MG tablet Take 1 tablet (10 mg total) by mouth daily. 01/26/22   Ferol Luz, MD  EPINEPHrine 0.3 mg/0.3 mL IJ SOAJ injection Inject 0.3 mg into the muscle as needed for anaphylaxis. 10/10/21   Wynetta Fines, MD  etonogestrel (NEXPLANON) 68 MG IMPL implant daily.    [provider]  Fluticasone-Umeclidin-Vilant (TRELEGY ELLIPTA) 200-62.5-25 MCG/ACT AEPB Inhale 1 puff into the lungs daily. 01/26/22   Ferol Luz, MD  ipratropium-albuterol (DUONEB) 0.5-2.5 (3) MG/3ML SOLN     [provider]  montelukast (SINGULAIR) 10 MG tablet Take 1 tablet (10 mg  total) by mouth as needed. 01/26/22   Ferol Luz, MD  predniSONE (DELTASONE) 20 MG tablet Take 2 tablets (40 mg total) by mouth daily. 07/06/22   Mardella Layman, MD  prenatal vitamin w/FE, FA (NATACHEW) 29-1 MG CHEW chewable tablet Chew 1 tablet by mouth daily at 12 noon.    [provider]      Allergies    Cocoa butter, Percocet [oxycodone-acetaminophen], Vicks vapo steam [camphor-menthol], and Pineapple    Review of Systems   Review of Systems  Musculoskeletal:  Positive for neck pain.  All other systems reviewed and are negative.   Physical Exam Updated Vital Signs BP 134/87 (BP Location: Right Arm)   Pulse 85   Temp 98.1 F (36.7 C) (Oral)   Resp 17   SpO2 100%  Physical Exam Vitals and nursing note reviewed.  Constitutional:      General: She is not in acute distress.    Appearance: Normal appearance. She is not ill-appearing.  HENT:     Head: Normocephalic and atraumatic.  Eyes:     General: No scleral icterus.       Right eye: No discharge.        Left eye: No discharge.     Extraocular Movements: Extraocular movements intact.     Conjunctiva/sclera: Conjunctivae normal.  Cardiovascular:     Rate and Rhythm: Normal rate and regular rhythm.     Pulses: Normal pulses.     Heart sounds: Normal heart sounds. No murmur  heard.    No friction rub. No gallop.  Pulmonary:     Effort: Pulmonary effort is normal. No respiratory distress.     Breath sounds: Normal breath sounds. No stridor. No wheezing, rhonchi or rales.  Abdominal:     General: Abdomen is flat.     Palpations: Abdomen is soft.     Tenderness: There is no abdominal tenderness.  Musculoskeletal:        General: Tenderness (Patient noted to have exquisite tenderness over the left trapezius muscle even to light touch, clavicle, and anterior and posterior aspects of deltoid.) present. No swelling, deformity or signs of injury. Normal range of motion.     Cervical back: Tenderness present.      Right lower leg: No edema.     Left lower leg: No edema.  Lymphadenopathy:     Cervical: No cervical adenopathy.  Skin:    General: Skin is warm and dry.     Coloration: Skin is not pale.     Findings: No bruising or erythema.  Neurological:     General: No focal deficit present.     Mental Status: She is alert. Mental status is at baseline.  Psychiatric:        Mood and Affect: Mood normal.     ED Results / Procedures / Treatments   Labs (all labs ordered are listed, but only abnormal results are displayed) Labs Reviewed  CBC WITH DIFFERENTIAL/PLATELET  BASIC METABOLIC PANEL    EKG None  Radiology MR Cervical Spine Wo Contrast Result Date: 07/16/2023 CLINICAL DATA:  Cervical radiculopathy, no red flags. Left-sided cervical pain in C3-C5 distribution. EXAM: MRI CERVICAL SPINE WITHOUT CONTRAST TECHNIQUE: Multiplanar, multisequence MR imaging of the cervical spine was performed. No intravenous contrast was administered. COMPARISON:  None. FINDINGS: Alignment: No significant spondylolisthesis. Vertebrae: Cervical vertebral body height is maintained. No significant marrow edema or focal worsen marrow lesion. Cord: T2 hyperintense signal within the central spinal cord at the C2-C3 levels, measuring 2.5 cm in craniocaudal dimension and up to 2-3 mm in width. Posterior Fossa, vertebral arteries, paraspinal tissues: No abnormality identified within included portions of the posterior fossa. Flow voids preserved within the imaged cervical vertebral arteries. No paraspinal mass or collection. Disc levels: Multilevel disc degeneration, greatest at C3-C4, C4-C5, C5-C6 and C6-C7 (moderate at these levels). T2 hypointense signal posterior to the C4, C5 and C6 vertebrae at midline. This may reflect thickening (and possible ossification) of the posterior longitudinal ligament. This appears to mildly narrow the spinal canal. C2-C3: No significant disc herniation or stenosis. C3-C4: Disc bulge with  bilateral uncovertebral hypertrophy. The disc bulge mildly effaces the ventral thecal sac. Moderate bilateral neural foraminal narrowing. C4-C5: Disc bulge with bilateral uncovertebral hypertrophy. Superimposed central disc protrusion. The disc protrusion effaces the ventral thecal sac and mildly flattens the ventral aspect of the spinal cord (resulting in overall mild-to-moderate spinal canal stenosis). Bilateral neural foraminal narrowing (moderate/severe right, moderate left). C5-C6: Posterior disc osteophyte complex with bilateral disc osteophyte ridge/uncinate hypertrophy. Mild spinal canal narrowing. Mild right neural foraminal narrowing. C6-C7: Central disc protrusion. Minimal bilateral uncovertebral hypertrophy. The disc protrusion results in mild spinal canal stenosis. No significant foraminal stenosis. C7-T1: No significant disc herniation or stenosis. IMPRESSION: 1. T2 hyperintense signal within the central spinal cord at the C2-C3 levels, measuring 2.5 cm in craniocaudal dimension and up to 2-3 mm in width. 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 Neurosurgical referral recommended. 2. Cervical spondylosis as outlined  within the body of the report. At C4-C5, a central disc protrusion effaces the thecal sac and mildly flattens the ventral aspect of the spinal cord (resulting in overall mild-to-moderate spinal canal stenosis). 3. No more than mild spinal canal narrowing at the remaining levels. 4. Multilevel foraminal stenosis, greatest bilaterally at C3-C4 (moderate) and bilaterally at C4-C5 (moderate/severe right, moderate left). 5. T2 hypointense signal posterior to the C4, C5 and C6 vertebrae at midline, compatible with thickening (and possible ossification) of the posterior longitudinal ligament. Consider a cervical spine CT to better assess for OPLL. Electronically Signed   By: Jackey Loge D.O.   On: 07/16/2023 18:13    Procedures Procedures     Medications Ordered in ED Medications - No data to display  ED Course/ Medical Decision Making/ A&P                                 Medical Decision Making  Patient is a 38 year old female presents ED today complaining of left-sided cervical pain and numbness that extends down left arm to midway down brachial region.  States she noticed it whenever she entered into her car yesterday and tried to look to her left and noticed that she was having increased pain. Pain has since been increasing and has been accompanied by paresthesias. Has not felt this sensation before.  Denies any trauma to the area, weakness, strength decrease.  On physical exam, patient said to be afebrile, no acute distress, speaking in full sentences with full ROM of both neck and arm.  However patient was noted to have exquisite tenderness to even light sensation across clavicle, trapezius muscle, midway down brachial region.  No decrease in strength.  Neer sign, Hawkins sign, empty can test negative.  No erythema or rashes or lesions or swelling noted to area.  MRI showed a T2 hyperintense signal at the level of C2-C3 that was 2.5 cm in the craniocaudal dimension and 2 to 3 mm in width that could reflect a hydrosyringomyelia or prominence of central canal.  Patient was also noted to have hypointense signal posterior to C4, C5, C6 vertebrae at midline compatible with thickening of the posterior longitudinal ligament.  Told to consider CT of cervical spine to assess for ossification.   Spoke with Dr. Doran Durand with neurosurgery who believe this patient to follow-up in outpatient setting.  With no need for further workup at this time.  With neurosurgery recommendation, believe patient stated discharge at a time with reassuring vitals, labs.  Will provide his information for her to call their office to schedule appointment.  I believe this patient is safe to be discharge at this time.  All questions were  answered.  Differential diagnoses prior to evaluation: The emergent differential diagnosis includes, but is not limited to, shingles, radiculopathy, myopathy, Pancoast tumor, costoclavicular syndrome, brachial plexopathy. This is not an exhaustive differential.   Past Medical History / Co-morbidities / Social History: Asthma  Additional history: Chart reviewed. Pertinent results include: Last ED visit was on 07/06/2022 for asthma.  Lab Tests/Imaging studies: I personally interpreted labs/imaging and the pertinent results include:   CBC unremarkable BMP unremarkable MRI a T2 hyperintense signal at the level of C2-C3 that was 2.5 cm in the craniocaudal dimension and 2 to 3 mm in width that could reflect a hydrosyringomyelia or prominence of central canal.  Patient was also noted to have hypointense signal posterior to C4, C5, C6 vertebrae at  midline compatible with thickening of the posterior longitudinal ligament.  Told to consider CT of cervical spine to assess for ossification.  I agree with the radiologist interpretation.  Medications: Medications required at this time.  I have reviewed the patients home medicines and have made adjustments as needed.    Disposition: After consideration of the diagnostic results and the patients response to treatment, I feel that the patient would benefit from discharge and treatment as above.   emergency department workup does not suggest an emergent condition requiring admission or immediate intervention beyond what has been performed at this time. The plan is: Follow-up with neurosurgery, gabapentin and naproxen for pain, return for any new or worsening symptoms. The patient is safe for discharge and has been instructed to return immediately for worsening symptoms, change in symptoms or any other concerns.  Final Clinical Impression(s) / ED Diagnoses Final diagnoses:  Neck pain    Rx / DC Orders ED Discharge Orders          Ordered     gabapentin (NEURONTIN) 300 MG capsule  3 times daily        07/16/23 1909    naproxen (NAPROSYN) 500 MG tablet  2 times daily        07/16/23 1909              Lavonia Drafts 07/16/23 1911    Gerhard Munch, MD 07/16/23 2344

## 2023-07-16 NOTE — Discharge Instructions (Addendum)
 You were seen today for left-sided neck pain.  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   I have sent in a 10-day course of gabapentin as well as some anti-inflammatory to help out with the pain in the interim.  Take as prescribed.  Please take Naprosyn, 500mg  by mouth twice daily as needed for pain - this in an antiinflammatory medicine (NSAID) and is similar to ibuprofen - many people feel that it is stronger than ibuprofen and it is easier to take since it is a smaller pill.  Please use this only for 1 week - if your pain persists, you will need to follow up with your doctor in the office for ongoing guidance and pain control.     Return to the ED if you begin to have any new or worsening symptoms including arm weakness, leg weakness, increasing in headache with fever, uncontrolled pain.

## 2023-07-16 NOTE — ED Triage Notes (Signed)
 Pt referred to ED from [facility] for advanced imaging. Pt c/o L neck pain radiating into her arm causing paresthesias. Pt denies known injury. She states this began yesterday around the middle of the day.

## 2023-07-18 ENCOUNTER — Other Ambulatory Visit: Payer: Self-pay

## 2023-07-18 ENCOUNTER — Encounter (HOSPITAL_COMMUNITY): Payer: Self-pay | Admitting: Emergency Medicine

## 2023-07-18 ENCOUNTER — Emergency Department (HOSPITAL_COMMUNITY)
Admission: EM | Admit: 2023-07-18 | Discharge: 2023-07-18 | Disposition: A | Discharge status: Home / Self Care | Attending: Emergency Medicine | Admitting: Emergency Medicine

## 2023-07-18 ENCOUNTER — Emergency Department (HOSPITAL_COMMUNITY)

## 2023-07-18 DIAGNOSIS — M542 Cervicalgia: Secondary | ICD-10-CM | POA: Insufficient documentation

## 2023-07-18 DIAGNOSIS — Z7952 Long term (current) use of systemic steroids: Secondary | ICD-10-CM | POA: Diagnosis not present

## 2023-07-18 DIAGNOSIS — M545 Low back pain, unspecified: Secondary | ICD-10-CM | POA: Diagnosis present

## 2023-07-18 DIAGNOSIS — R202 Paresthesia of skin: Secondary | ICD-10-CM

## 2023-07-18 DIAGNOSIS — J45909 Unspecified asthma, uncomplicated: Secondary | ICD-10-CM | POA: Diagnosis not present

## 2023-07-18 DIAGNOSIS — M5126 Other intervertebral disc displacement, lumbar region: Secondary | ICD-10-CM | POA: Insufficient documentation

## 2023-07-18 LAB — CBC
HCT: 40.4 % (ref 36.0–46.0)
Hemoglobin: 13.2 g/dL (ref 12.0–15.0)
MCH: 30.7 pg (ref 26.0–34.0)
MCHC: 32.7 g/dL (ref 30.0–36.0)
MCV: 94 fL (ref 80.0–100.0)
Platelets: 251 10*3/uL (ref 150–400)
RBC: 4.3 MIL/uL (ref 3.87–5.11)
RDW: 13.1 % (ref 11.5–15.5)
WBC: 8.2 10*3/uL (ref 4.0–10.5)
nRBC: 0 % (ref 0.0–0.2)

## 2023-07-18 LAB — RESP PANEL BY RT-PCR (RSV, FLU A&B, COVID)  RVPGX2
Influenza A by PCR: NEGATIVE
Influenza B by PCR: NEGATIVE
Resp Syncytial Virus by PCR: NEGATIVE
SARS Coronavirus 2 by RT PCR: NEGATIVE

## 2023-07-18 LAB — PREGNANCY, URINE: Preg Test, Ur: NEGATIVE

## 2023-07-18 LAB — CK: Total CK: 123 U/L (ref 38–234)

## 2023-07-18 LAB — BASIC METABOLIC PANEL
Anion gap: 8 (ref 5–15)
BUN: 14 mg/dL (ref 6–20)
CO2: 24 mmol/L (ref 22–32)
Calcium: 9 mg/dL (ref 8.9–10.3)
Chloride: 104 mmol/L (ref 98–111)
Creatinine, Ser: 0.88 mg/dL (ref 0.44–1.00)
GFR, Estimated: >60 mL/min (ref >60)
Glucose, Bld: 101 mg/dL — ABNORMAL HIGH (ref 70–99)
Potassium: 3.7 mmol/L (ref 3.5–5.1)
Sodium: 136 mmol/L (ref 135–145)

## 2023-07-18 MED ORDER — LORAZEPAM 1 MG PO TABS
1.0000 mg | ORAL_TABLET | Freq: Once | ORAL | Status: AC
  Administered 2023-07-18: 1 mg via ORAL
  Filled 2023-07-18: qty 1

## 2023-07-18 MED ORDER — METHYLPREDNISOLONE 4 MG PO TBPK: ORAL_TABLET | ORAL | 0 refills | Status: AC

## 2023-07-18 MED ORDER — KETOROLAC TROMETHAMINE 15 MG/ML IJ SOLN
15.0000 mg | Freq: Once | INTRAMUSCULAR | Status: AC
  Administered 2023-07-18: 15 mg via INTRAMUSCULAR
  Filled 2023-07-18: qty 1

## 2023-07-18 MED ORDER — TRAMADOL HCL 50 MG PO TABS: 50.0000 mg | ORAL_TABLET | Freq: Four times a day (QID) | ORAL | 0 refills | Status: DC | PRN

## 2023-07-18 MED ORDER — METHOCARBAMOL 500 MG PO TABS
1000.0000 mg | ORAL_TABLET | Freq: Once | ORAL | Status: AC
  Administered 2023-07-18: 1000 mg via ORAL
  Filled 2023-07-18: qty 2

## 2023-07-18 NOTE — ED Provider Notes (Signed)
 Handoff from S. Smoot PA, pt w/spontaneous L arm numbnessx2 days. Pending MRIs. Pt now has more tingling in hands, myalgias in legs. Sensation change in face. Had MRI 2 days ago, neurosurgery was consulted and was told to f/u OP. Worsening symptoms thus another MRI.  Physical Exam  BP 121/86 (BP Location: Right Arm)   Pulse 87   Temp (!) 97.4 F (36.3 C) (Oral)   Resp 18   Ht 5\' 1"  (1.549 m)   Wt 75.8 kg   SpO2 100%   BMI 31.55 kg/m   Physical Exam Vitals and nursing note reviewed.  Constitutional:      General: She is not in acute distress.    Appearance: She is well-developed.  HENT:     Head: Normocephalic and atraumatic.  Eyes:     Conjunctiva/sclera: Conjunctivae normal.  Cardiovascular:     Rate and Rhythm: Normal rate and regular rhythm.     Heart sounds: No murmur heard. Pulmonary:     Effort: Pulmonary effort is normal. No respiratory distress.     Breath sounds: Normal breath sounds.  Abdominal:     Palpations: Abdomen is soft.     Tenderness: There is no abdominal tenderness.  Musculoskeletal:        General: No swelling.     Cervical back: Neck supple.  Skin:    General: Skin is warm and dry.     Capillary Refill: Capillary refill takes less than 2 seconds.  Neurological:     Mental Status: She is alert.     Cranial Nerves: No cranial nerve deficit.     Motor: No weakness.     Comments: Perceived sensory deficits, to bilateral arms.  Psychiatric:        Mood and Affect: Mood normal.     Procedures  Procedures  ED Course / MDM    Medical Decision Making Patient is a well-appearing 38 year old female, here for more tingling in her hands, myalgias in the legs.  Some left arm numbness.  MRIs reviewed, no brain masses, or findings.  Found to have similar MRI findings, to 2 days prior.  No changes.  I spoke with neurosurgery, NP Esperanza Richters, and she states follow-up in clinic, no changes to plan now.  Also found to have herniated disc, of the low back, but no  red flag symptoms, good strength, no loss of bowel, bladder, discharged home with some tramadol, and prednisone, to help with his pain  Amount and/or Complexity of Data Reviewed Labs: ordered. Radiology: ordered.  Risk Prescription drug management.         Pete Pelt, [place] 07/18/23 2339    Royanne Foots, DO 07/24/23 [id number]

## 2023-07-18 NOTE — Discharge Instructions (Addendum)
 Please follow-up with your primary care doctor, and the neurosurgeon, Dr. Doran Durand, as instructed.  Return to the ER if you feel like you have any weakness of your legs, loss of bowel, bladder.

## 2023-07-18 NOTE — ED Provider Notes (Signed)
 [place] EMERGENCY DEPARTMENT AT [facility] Provider Note   CSN: [id number] Arrival date & time: 07/18/23  1349     History {Add pertinent medical, surgical, social history, OB history to HPI:1} Chief Complaint  Patient presents with   Back Pain    Lauren Garner is a 38 y.o. female.  Patient with history of asthma presents today with complaints of back pain. She states that same began initially when she was riding in a car 2 days ago. She states that she did not move a specific way or feel like the car movements could have caused this discomfort. She notes that she was feeling pain in her neck as well as left side musculature. Also had some sensation changes in her left arm. Was seen for same, had MRI that had some concerning findings, however neurosurgery recommended outpatient follow-up. She was sent home with gabapentin and naproxen, states she has been taking this, however the gabapentin makes her too sleepy to function and the naproxen does not help at all.  Today she now feels like she is having some pain on the right side of her neck as well as sensation changes in both her hands and her face.  She also notes she is having some lower back pain and pain in her legs as well.  Denies any sensation changes in her legs or feet.  She also notes that she is having a mild headache.  She denies any weakness or vision changes.  No loss of bowel or bladder function or saddle paresthesias.  No history of IVDU.  No history of malignancy.  She is able to walk.  She tried to contact neurosurgery to schedule an appointment, however they have not called her back yet.  The history is provided by the patient. No language interpreter was used.  Back Pain      Home Medications Prior to Admission medications   Medication Sig Start Date End Date Taking? Authorizing Provider  albuterol (VENTOLIN HFA) 108 (90 Base) MCG/ACT inhaler Inhale 2 puffs into the lungs every 6 (six) hours as  needed for wheezing or shortness of breath. 01/26/22   Ferol Luz, MD  cetirizine (ZYRTEC) 10 MG tablet Take 1 tablet (10 mg total) by mouth daily. 01/26/22   Ferol Luz, MD  EPINEPHrine 0.3 mg/0.3 mL IJ SOAJ injection Inject 0.3 mg into the muscle as needed for anaphylaxis. 10/10/21   Wynetta Fines, MD  etonogestrel (NEXPLANON) 68 MG IMPL implant daily.    [provider]  Fluticasone-Umeclidin-Vilant (TRELEGY ELLIPTA) 200-62.5-25 MCG/ACT AEPB Inhale 1 puff into the lungs daily. 01/26/22   Ferol Luz, MD  gabapentin (NEURONTIN) 300 MG capsule Take 1 capsule (300 mg total) by mouth 3 (three) times daily for 10 days. 07/16/23 07/26/23  Lunette Stands, PA-C  ipratropium-albuterol (DUONEB) 0.5-2.5 (3) MG/3ML SOLN     [provider]  montelukast (SINGULAIR) 10 MG tablet Take 1 tablet (10 mg total) by mouth as needed. 01/26/22   Ferol Luz, MD  naproxen (NAPROSYN) 500 MG tablet Take 1 tablet (500 mg total) by mouth 2 (two) times daily. 07/16/23   Lunette Stands, PA-C  predniSONE (DELTASONE) 20 MG tablet Take 2 tablets (40 mg total) by mouth daily. 07/06/22   Mardella Layman, MD  prenatal vitamin w/FE, FA (NATACHEW) 29-1 MG CHEW chewable tablet Chew 1 tablet by mouth daily at 12 noon.    [provider]      Allergies    Cocoa butter, Percocet [  oxycodone-acetaminophen], Vicks vapo steam [camphor-menthol], and Pineapple    Review of Systems   Review of Systems  Musculoskeletal:  Positive for back pain.  All other systems reviewed and are negative.   Physical Exam Updated Vital Signs BP 135/81   Pulse (!) 102   Temp 98.7 F (37.1 C) (Oral)   Resp 18   Ht 5\' 1"  (1.549 m)   Wt 75.8 kg   SpO2 100%   BMI 31.55 kg/m  Physical Exam Vitals and nursing note reviewed.  Constitutional:      General: She is not in acute distress.    Appearance: Normal appearance. She is normal weight. She is not ill-appearing, toxic-appearing or diaphoretic.  HENT:      Head: Normocephalic and atraumatic.  Cardiovascular:     Rate and Rhythm: Normal rate.  Pulmonary:     Effort: Pulmonary effort is normal. No respiratory distress.  Musculoskeletal:        General: Normal range of motion.     Cervical back: Normal range of motion.     Comments: Very mild tenderness to palpation of the cervical spine without step-offs, lesions, deformity, or overlying skin changes.  Palpable muscle tightness and tenderness noted to the left neck and shoulder area extending into the subscapular area.  No overlying skin changes.   Mild TTP noted to palpation of the thoracic and lumbar spine.  No step-offs, lesions, deformity, or overlying skin changes.  Generalized muscular tenderness noted to bilateral lower extremities. Normal sensation. DP and PT pulses intact and 2+.  No overlying skin changes or deformity.  No calf tenderness.  Skin:    General: Skin is warm and dry.  Neurological:     General: No focal deficit present.     Mental Status: She is alert and oriented to person, place, and time.     GCS: GCS eye subscore is 4. GCS verbal subscore is 5. GCS motor subscore is 6.     Sensory: Sensation is intact.     Motor: Motor function is intact.     Coordination: Coordination is intact.     Gait: Gait is intact.     Comments: Alert and oriented to self, place, time and event.    Speech is fluent, clear without dysarthria or dysphasia.    Strength 5/5 in upper/lower extremities    Patient notes numbness to the left hand, arm, and upper chest area.  Notes numbness to the right hand exclusively as well.   CN I not tested  CN II grossly intact visual fields bilaterally. Did not visualize posterior eye.  CN III, IV, VI PERRLA and EOMs intact bilaterally  CN V subjective sensation changes to the bilateral face CN VII facial movements symmetric  CN VIII not tested  CN IX, X no uvula deviation, symmetric rise of soft palate  CN XI 5/5 SCM and trapezius strength  bilaterally  CN XII Midline tongue protrusion, symmetric L/R movements   Psychiatric:        Mood and Affect: Mood normal.        Behavior: Behavior normal.     ED Results / Procedures / Treatments   Labs (all labs ordered are listed, but only abnormal results are displayed) Labs Reviewed  RESP PANEL BY RT-PCR (RSV, FLU A&B, COVID)  [id number]  PREGNANCY, URINE    EKG None  Radiology MR Cervical Spine Wo Contrast Result Date: 07/16/2023 CLINICAL DATA:  Cervical radiculopathy, no red flags. Left-sided cervical pain in C3-C5 distribution. EXAM:  MRI CERVICAL SPINE WITHOUT CONTRAST TECHNIQUE: Multiplanar, multisequence MR imaging of the cervical spine was performed. No intravenous contrast was administered. COMPARISON:  None. FINDINGS: Alignment: No significant spondylolisthesis. Vertebrae: Cervical vertebral body height is maintained. No significant marrow edema or focal worsen marrow lesion. Cord: T2 hyperintense signal within the central spinal cord at the C2-C3 levels, measuring 2.5 cm in craniocaudal dimension and up to 2-3 mm in width. Posterior Fossa, vertebral arteries, paraspinal tissues: No abnormality identified within included portions of the posterior fossa. Flow voids preserved within the imaged cervical vertebral arteries. No paraspinal mass or collection. Disc levels: Multilevel disc degeneration, greatest at C3-C4, C4-C5, C5-C6 and C6-C7 (moderate at these levels). T2 hypointense signal posterior to the C4, C5 and C6 vertebrae at midline. This may reflect thickening (and possible ossification) of the posterior longitudinal ligament. This appears to mildly narrow the spinal canal. C2-C3: No significant disc herniation or stenosis. C3-C4: Disc bulge with bilateral uncovertebral hypertrophy. The disc bulge mildly effaces the ventral thecal sac. Moderate bilateral neural foraminal narrowing. C4-C5: Disc bulge with bilateral uncovertebral hypertrophy. Superimposed central disc protrusion.  The disc protrusion effaces the ventral thecal sac and mildly flattens the ventral aspect of the spinal cord (resulting in overall mild-to-moderate spinal canal stenosis). Bilateral neural foraminal narrowing (moderate/severe right, moderate left). C5-C6: Posterior disc osteophyte complex with bilateral disc osteophyte ridge/uncinate hypertrophy. Mild spinal canal narrowing. Mild right neural foraminal narrowing. C6-C7: Central disc protrusion. Minimal bilateral uncovertebral hypertrophy. The disc protrusion results in mild spinal canal stenosis. No significant foraminal stenosis. C7-T1: No significant disc herniation or stenosis. IMPRESSION: 1. T2 hyperintense signal within the central spinal cord at the C2-C3 levels, measuring 2.5 cm in craniocaudal dimension and up to 2-3 mm in width. 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 Neurosurgical referral recommended. 2. Cervical spondylosis as outlined within the body of the report. At C4-C5, a central disc protrusion effaces the thecal sac and mildly flattens the ventral aspect of the spinal cord (resulting in overall mild-to-moderate spinal canal stenosis). 3. No more than mild spinal canal narrowing at the remaining levels. 4. Multilevel foraminal stenosis, greatest bilaterally at C3-C4 (moderate) and bilaterally at C4-C5 (moderate/severe right, moderate left). 5. T2 hypointense signal posterior to the C4, C5 and C6 vertebrae at midline, compatible with thickening (and possible ossification) of the posterior longitudinal ligament. Consider a cervical spine CT to better assess for OPLL. Electronically Signed   By: Jackey Loge D.O.   On: 07/16/2023 18:13    Procedures Procedures  {Document cardiac monitor, telemetry assessment procedure when appropriate:1}  Medications Ordered in ED Medications  ketorolac (TORADOL) 15 MG/ML injection 15 mg (has no administration in time range)  methocarbamol (ROBAXIN)  tablet 1,000 mg (has no administration in time range)    ED Course/ Medical Decision Making/ A&P   {   Click here for ABCD2, HEART and other calculatorsREFRESH Note before signing :1}                              Medical Decision Making Amount and/or Complexity of Data Reviewed Labs: ordered.  Risk Prescription drug management.   This patient is a 38 y.o. female who presents to the ED for concern of back pain, this involves an extensive number of treatment options, and is a complaint that carries with it a high risk of complications and morbidity. The emergent differential diagnosis prior to evaluation includes, but is not  limited to,  *** . This is not an exhaustive differential.   Past Medical History / Co-morbidities / Social History: ***  Additional history: Chart reviewed. Pertinent results include: ***  Physical Exam: Physical exam performed. The pertinent findings include: ***  Lab Tests: I ordered, and personally interpreted labs.  The pertinent results include:  ***   Imaging Studies: I ordered imaging studies including ***. I independently visualized and interpreted imaging which showed ***. I agree with the radiologist interpretation.   Cardiac Monitoring:  The patient was maintained on a cardiac monitor.  My attending physician Dr. Marland Kitchen viewed and interpreted the cardiac monitored which showed an underlying rhythm of: ***. I agree with this interpretation.   Medications: I ordered medication including ***  for ***. Reevaluation of the patient after these medicines showed that the patient {resolved/improved/worsened:23923::"improved"}. I have reviewed the patients home medicines and have made adjustments as needed.  Consultations Obtained: I requested consultation with the ***,  and discussed lab and imaging findings as well as pertinent plan - they recommend: ***   Disposition: After consideration of the diagnostic results and the patients response to treatment,  I feel that *** .   ***emergency department workup does not suggest an emergent condition requiring admission or immediate intervention beyond what has been performed at this time. The plan is: ***. The patient is safe for discharge and has been instructed to return immediately for worsening symptoms, change in symptoms or any other concerns.  I discussed this case with my attending physician Dr. Marland Kitchen who cosigned this note including patient's presenting symptoms, physical exam, and planned diagnostics and interventions. Attending physician stated agreement with plan or made changes to plan which were implemented.     {Document critical care time when appropriate:1} {Document review of labs and clinical decision tools ie heart score, Chads2Vasc2 etc:1}  {Document your independent review of radiology images, and any outside records:1} {Document your discussion with family members, caretakers, and with consultants:1} {Document social determinants of health affecting pt's care:1} {Document your decision making why or why not admission, treatments were needed:1} Final Clinical Impression(s) / ED Diagnoses Final diagnoses:  None    Rx / DC Orders ED Discharge Orders     None

## 2023-07-18 NOTE — ED Triage Notes (Signed)
 Pt arrives pov. Was seen for left neck pain radiating down her arm on Sunday. She is now experiencing back and leg pain describes as an achy feeling and sensation changes in both hands. She has had to use rescue inhaler today.  Spasms still in neck as well

## 2023-07-19 NOTE — ED Provider Notes (Incomplete)
 [place] EMERGENCY DEPARTMENT AT [facility] Provider Note   CSN: [id number] Arrival date & time: 07/18/23  1349     History {Add pertinent medical, surgical, social history, OB history to HPI:1} Chief Complaint  Patient presents with  . Back Pain    Lauren Garner is a 38 y.o. female.  Patient with history of asthma presents today with complaints of back pain. She states that same began initially when she was riding in a car 2 days ago. She states that she did not move a specific way or feel like the car movements could have caused this discomfort. She notes that she was feeling pain in her neck as well as left side musculature. Also had some sensation changes in her left arm. Was seen for same, had MRI that had some concerning findings, however neurosurgery recommended outpatient follow-up. She was sent home with gabapentin and naproxen, states she has been taking this, however the gabapentin makes her too sleepy to function and the naproxen does not help at all.  Today she now feels like she is having some pain on the right side of her neck as well as sensation changes in both her hands and her face.  She also notes she is having some lower back pain and pain in her legs as well.  Denies any sensation changes in her legs or feet.  She also notes that she is having a mild headache.  She denies any weakness or vision changes.  No loss of bowel or bladder function or saddle paresthesias.  No history of IVDU.  No history of malignancy.  She is able to walk.  She tried to contact neurosurgery to schedule an appointment, however they have not called her back yet.  The history is provided by the patient. No language interpreter was used.  Back Pain      Home Medications Prior to Admission medications   Medication Sig Start Date End Date Taking? Authorizing Provider  albuterol (VENTOLIN HFA) 108 (90 Base) MCG/ACT inhaler Inhale 2 puffs into the lungs every 6 (six) hours as  needed for wheezing or shortness of breath. 01/26/22   Ferol Luz, MD  cetirizine (ZYRTEC) 10 MG tablet Take 1 tablet (10 mg total) by mouth daily. 01/26/22   Ferol Luz, MD  EPINEPHrine 0.3 mg/0.3 mL IJ SOAJ injection Inject 0.3 mg into the muscle as needed for anaphylaxis. 10/10/21   Wynetta Fines, MD  etonogestrel (NEXPLANON) 68 MG IMPL implant daily.    [provider]  Fluticasone-Umeclidin-Vilant (TRELEGY ELLIPTA) 200-62.5-25 MCG/ACT AEPB Inhale 1 puff into the lungs daily. 01/26/22   Ferol Luz, MD  gabapentin (NEURONTIN) 300 MG capsule Take 1 capsule (300 mg total) by mouth 3 (three) times daily for 10 days. 07/16/23 07/26/23  Lunette Stands, PA-C  ipratropium-albuterol (DUONEB) 0.5-2.5 (3) MG/3ML SOLN     [provider]  montelukast (SINGULAIR) 10 MG tablet Take 1 tablet (10 mg total) by mouth as needed. 01/26/22   Ferol Luz, MD  naproxen (NAPROSYN) 500 MG tablet Take 1 tablet (500 mg total) by mouth 2 (two) times daily. 07/16/23   Lunette Stands, PA-C  predniSONE (DELTASONE) 20 MG tablet Take 2 tablets (40 mg total) by mouth daily. 07/06/22   Mardella Layman, MD  prenatal vitamin w/FE, FA (NATACHEW) 29-1 MG CHEW chewable tablet Chew 1 tablet by mouth daily at 12 noon.    [provider]      Allergies    Cocoa butter, Percocet [  oxycodone-acetaminophen], Vicks vapo steam [camphor-menthol], and Pineapple    Review of Systems   Review of Systems  Musculoskeletal:  Positive for back pain.  All other systems reviewed and are negative.   Physical Exam Updated Vital Signs BP 135/81   Pulse (!) 102   Temp 98.7 F (37.1 C) (Oral)   Resp 18   Ht 5\' 1"  (1.549 m)   Wt 75.8 kg   SpO2 100%   BMI 31.55 kg/m  Physical Exam Vitals and nursing note reviewed.  Constitutional:      General: She is not in acute distress.    Appearance: Normal appearance. She is normal weight. She is not ill-appearing, toxic-appearing or diaphoretic.  HENT:      Head: Normocephalic and atraumatic.  Cardiovascular:     Rate and Rhythm: Normal rate.  Pulmonary:     Effort: Pulmonary effort is normal. No respiratory distress.  Musculoskeletal:        General: Normal range of motion.     Cervical back: Normal range of motion.     Comments: Very mild tenderness to palpation of the cervical spine without step-offs, lesions, deformity, or overlying skin changes.  Palpable muscle tightness and tenderness noted to the left neck and shoulder area extending into the subscapular area.  No overlying skin changes.   Mild TTP noted to palpation of the thoracic and lumbar spine.  No step-offs, lesions, deformity, or overlying skin changes.  Generalized muscular tenderness noted to bilateral lower extremities. Normal sensation. DP and PT pulses intact and 2+.  No overlying skin changes or deformity.  No calf tenderness.  Skin:    General: Skin is warm and dry.  Neurological:     General: No focal deficit present.     Mental Status: She is alert and oriented to person, place, and time.     GCS: GCS eye subscore is 4. GCS verbal subscore is 5. GCS motor subscore is 6.     Sensory: Sensation is intact.     Motor: Motor function is intact.     Coordination: Coordination is intact.     Gait: Gait is intact.     Comments: Alert and oriented to self, place, time and event.    Speech is fluent, clear without dysarthria or dysphasia.    Strength 5/5 in upper/lower extremities    Patient notes numbness to the left hand, arm, and upper chest area.  Notes numbness to the right hand exclusively as well.   CN I not tested  CN II grossly intact visual fields bilaterally. Did not visualize posterior eye.  CN III, IV, VI PERRLA and EOMs intact bilaterally  CN V subjective sensation changes to the bilateral face CN VII facial movements symmetric  CN VIII not tested  CN IX, X no uvula deviation, symmetric rise of soft palate  CN XI 5/5 SCM and trapezius strength  bilaterally  CN XII Midline tongue protrusion, symmetric L/R movements   Psychiatric:        Mood and Affect: Mood normal.        Behavior: Behavior normal.     ED Results / Procedures / Treatments   Labs (all labs ordered are listed, but only abnormal results are displayed) Labs Reviewed  RESP PANEL BY RT-PCR (RSV, FLU A&B, COVID)  RVPGX2  PREGNANCY, URINE    EKG None  Radiology MR Cervical Spine Wo Contrast Result Date: 07/16/2023 CLINICAL DATA:  Cervical radiculopathy, no red flags. Left-sided cervical pain in C3-C5 distribution. EXAM:  MRI CERVICAL SPINE WITHOUT CONTRAST TECHNIQUE: Multiplanar, multisequence MR imaging of the cervical spine was performed. No intravenous contrast was administered. COMPARISON:  None. FINDINGS: Alignment: No significant spondylolisthesis. Vertebrae: Cervical vertebral body height is maintained. No significant marrow edema or focal worsen marrow lesion. Cord: T2 hyperintense signal within the central spinal cord at the C2-C3 levels, measuring 2.5 cm in craniocaudal dimension and up to 2-3 mm in width. Posterior Fossa, vertebral arteries, paraspinal tissues: No abnormality identified within included portions of the posterior fossa. Flow voids preserved within the imaged cervical vertebral arteries. No paraspinal mass or collection. Disc levels: Multilevel disc degeneration, greatest at C3-C4, C4-C5, C5-C6 and C6-C7 (moderate at these levels). T2 hypointense signal posterior to the C4, C5 and C6 vertebrae at midline. This may reflect thickening (and possible ossification) of the posterior longitudinal ligament. This appears to mildly narrow the spinal canal. C2-C3: No significant disc herniation or stenosis. C3-C4: Disc bulge with bilateral uncovertebral hypertrophy. The disc bulge mildly effaces the ventral thecal sac. Moderate bilateral neural foraminal narrowing. C4-C5: Disc bulge with bilateral uncovertebral hypertrophy. Superimposed central disc protrusion.  The disc protrusion effaces the ventral thecal sac and mildly flattens the ventral aspect of the spinal cord (resulting in overall mild-to-moderate spinal canal stenosis). Bilateral neural foraminal narrowing (moderate/severe right, moderate left). C5-C6: Posterior disc osteophyte complex with bilateral disc osteophyte ridge/uncinate hypertrophy. Mild spinal canal narrowing. Mild right neural foraminal narrowing. C6-C7: Central disc protrusion. Minimal bilateral uncovertebral hypertrophy. The disc protrusion results in mild spinal canal stenosis. No significant foraminal stenosis. C7-T1: No significant disc herniation or stenosis. IMPRESSION: 1. T2 hyperintense signal within the central spinal cord at the C2-C3 levels, measuring 2.5 cm in craniocaudal dimension and up to 2-3 mm in width. 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 Neurosurgical referral recommended. 2. Cervical spondylosis as outlined within the body of the report. At C4-C5, a central disc protrusion effaces the thecal sac and mildly flattens the ventral aspect of the spinal cord (resulting in overall mild-to-moderate spinal canal stenosis). 3. No more than mild spinal canal narrowing at the remaining levels. 4. Multilevel foraminal stenosis, greatest bilaterally at C3-C4 (moderate) and bilaterally at C4-C5 (moderate/severe right, moderate left). 5. T2 hypointense signal posterior to the C4, C5 and C6 vertebrae at midline, compatible with thickening (and possible ossification) of the posterior longitudinal ligament. Consider a cervical spine CT to better assess for OPLL. Electronically Signed   By: Jackey Loge D.O.   On: 07/16/2023 18:13    Procedures Procedures  {Document cardiac monitor, telemetry assessment procedure when appropriate:1}  Medications Ordered in ED Medications  ketorolac (TORADOL) 15 MG/ML injection 15 mg (has no administration in time range)  methocarbamol (ROBAXIN)  tablet 1,000 mg (has no administration in time range)    ED Course/ Medical Decision Making/ A&P   {   Click here for ABCD2, HEART and other calculatorsREFRESH Note before signing :1}                              Medical Decision Making Amount and/or Complexity of Data Reviewed Labs: ordered. Radiology: ordered.  Risk Prescription drug management.   This patient is a 38 y.o. female who presents to the ED for concern of back pain, this involves an extensive number of treatment options, and is a complaint that carries with it a high risk of complications and morbidity. The emergent differential diagnosis prior to evaluation includes, but  is not limited to,  Fracture (acute/chronic), muscle strain, cauda equina, spinal stenosis, DDD, metastatic cancer, vertebral osteomyelitis, kidney stone, pyelonephritis, AAA, pancreatitis, bowel obstruction, meningitis.   This is not an exhaustive differential.   Past Medical History / Co-morbidities / Social History:  has a past medical history of Asthma.  Additional history: Chart reviewed. 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  Neurosurgery was consulted at that time and recommendations were for discharge with outpatient follow-up.  Patient was sent home with gabapentin and naproxen.  Physical Exam: Physical exam performed. The pertinent findings include: ***  Lab Tests: I ordered, and personally interpreted labs.  The pertinent results include:  ***   Imaging Studies: I ordered imaging studies including ***. I independently visualized and interpreted imaging which showed ***. I agree with the radiologist interpretation.   Cardiac Monitoring:  The patient was maintained on a cardiac monitor.  My attending physician Dr. Marland Kitchen viewed and interpreted the cardiac monitored which showed an underlying rhythm of: ***. I agree with this interpretation.    Medications: I ordered medication including ***  for ***. Reevaluation of the patient after these medicines showed that the patient {resolved/improved/worsened:23923::"improved"}. I have reviewed the patients home medicines and have made adjustments as needed.  Consultations Obtained: I requested consultation with the ***,  and discussed lab and imaging findings as well as pertinent plan - they recommend: ***   Disposition: After consideration of the diagnostic results and the patients response to treatment, I feel that *** .   ***emergency department workup does not suggest an emergent condition requiring admission or immediate intervention beyond what has been performed at this time. The plan is: ***. The patient is safe for discharge and has been instructed to return immediately for worsening symptoms, change in symptoms or any other concerns.  I discussed this case with my attending physician Dr. Marland Kitchen who cosigned this note including patient's presenting symptoms, physical exam, and planned diagnostics and interventions. Attending physician stated agreement with plan or made changes to plan which were implemented.     {Document critical care time when appropriate:1} {Document review of labs and clinical decision tools ie heart score, Chads2Vasc2 etc:1}  {Document your independent review of radiology images, and any outside records:1} {Document your discussion with family members, caretakers, and with consultants:1} {Document social determinants of health affecting pt's care:1} {Document your decision making why or why not admission, treatments were needed:1} Final Clinical Impression(s) / ED Diagnoses Final diagnoses:  None    Rx / DC Orders ED Discharge Orders     None

## 2023-07-26 ENCOUNTER — Ambulatory Visit
Admission: RE | Admit: 2023-07-26 | Discharge: 2023-07-26 | Disposition: A | Discharge status: Home / Self Care | Payer: Self-pay | Source: Ambulatory Visit | Attending: Family Medicine | Admitting: Family Medicine

## 2023-07-26 ENCOUNTER — Other Ambulatory Visit: Payer: Self-pay | Admitting: Family Medicine

## 2023-07-26 ENCOUNTER — Ambulatory Visit: Payer: Self-pay

## 2023-07-26 DIAGNOSIS — N644 Mastodynia: Secondary | ICD-10-CM

## 2023-08-08 ENCOUNTER — Other Ambulatory Visit: Payer: Self-pay | Admitting: Internal Medicine

## 2024-02-09 ENCOUNTER — Encounter: Payer: Self-pay | Admitting: Neurosurgery

## 2024-02-09 ENCOUNTER — Ambulatory Visit: Admitting: Neurosurgery

## 2024-02-09 VITALS — BP 107/75 | HR 85 | Ht 61.0 in

## 2024-02-09 DIAGNOSIS — M5030 Other cervical disc degeneration, unspecified cervical region: Secondary | ICD-10-CM

## 2024-02-09 DIAGNOSIS — R9089 Other abnormal findings on diagnostic imaging of central nervous system: Secondary | ICD-10-CM

## 2024-02-09 DIAGNOSIS — G950 Syringomyelia and syringobulbia: Secondary | ICD-10-CM

## 2024-02-09 NOTE — Progress Notes (Signed)
 Assessment : 38 year old lady who since her childhood has had headaches when she would shear, scream, laugh, cough.  In March she had an episode of severe neck pain rating down to her left arm.  This was persistent got worse with pain in her legs as well.  She went to the emergency room after going to urgent care and an MRI of her cervical spine was done.  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  She saw a neurosurgeon at [facility] and he told her that she had a Chiari I malformation which could be treated with surgery but recommended follow-up and she saw him again once but got the recommendation to come back in January.  She is not entirely clear what the reasoning for this was and I do not have the documents to review for this.  She says clearly that she does not have any chronic headaches, does not have any headache when she lifts heavy objects or has a bowel movement.  It only happens when she screams and coughs and sneezes.  Patient is married and has children.  She works as a Chartered loss adjuster.  She was referred to me because she was not happy with the follow-up in January.  Plan : I reviewed the cervical spine MRI with her and explained to her what the anatomy entails.  All this was recorded by her on her phone on a video.  I explained to her what the brainstem is as well as the cerebellum and what a Chiari malformation looks like.  She has already done her own review on [employer] and acknowledged that.  I showed her where the bottom of the skull is and she does not have a Chiari I malformation.  She maybe has 2 mm descent of the cerebellum which is considered within the normal limits. Therefore, I do not think she needs a posterior fossa decompression.  The second thing that is visible is hyperintensity on the T2 sequences in the upper cervical spine into the lower brainstem.  I  told her that this could be a syrinx but more likely than not it appears as a dilated central canal as noted by the radiologist.  I explained to her the difference between those 2 and told her that if she was my wife, I would not worry about this right now and recommended a 1 year follow-up MRI which would be in March 2026.  Lastly, I do not think that she has idiopathic intracranial hypertension because she does not have the hallmarks for this.  However, if she does develop chronic headaches and blurry vision, then we would need to work that up.  She does not have an empty sella on the MRI and therefore I feel comfortable that this does not require workup at this moment.  I told her that her left arm pain most likely is from cervical spinal problems and recommended that she goes back to talk to her neurosurgeon at [facility] but she does not want to go back to them and asked me to refer her to somebody else.  Therefore, I will refer her to my partner in [place], Dr. Penne Sharps, who can counsel her about what to do next.  I will see her back in March and she will come and see me sooner as needed.   Social History   Socioeconomic History   Marital  status: Married    Spouse name: Not on file   Number of children: Not on file   Years of education: Not on file   Highest education level: Not on file  Occupational History   Not on file  Tobacco Use   Smoking status: Never   Smokeless tobacco: Never  Vaping Use   Vaping status: Never Used  Substance and Sexual Activity   Alcohol use: Not Currently    Comment: not since pregnant   Drug use: Never   Sexual activity: Yes  Other Topics Concern   Not on file  Social History Narrative   Not on file   Social Drivers of Health   Financial Resource Strain: Not on file  Food Insecurity: Not on file  Transportation Needs: Not on file  Physical Activity: Not on file  Stress: Not on file  Social Connections: Unknown (11/28/2021)   [facility] from  [facility]   Social Network    Social Network: Not on file  Intimate Partner Violence: Unknown (11/28/2021)   Received from [facility]   HITS    Physically Hurt: Not on file    Insult or Talk Down To: Not on file    Threaten Physical Harm: Not on file    Scream or Curse: Not on file    Family History  Problem Relation Age of Onset   Cancer Mother    Hypertension Mother    Hyperlipidemia Mother    Allergic rhinitis Father    Cancer Father    Hyperlipidemia Father    Breast cancer Maternal Grandmother    Breast cancer Paternal Grandmother    Angioedema Neg Hx    Asthma Neg Hx    Urticaria Neg Hx    Immunodeficiency Neg Hx    Eczema Neg Hx     Allergies  Allergen Reactions   Cocoa Butter Hives   Percocet [Oxycodone -Acetaminophen ] Itching    **patient says its the OXY part of the percocet, but she can take tylenol  just fine   Vicks Vapo Steam [Camphor-Menthol]    Pineapple Rash    Past Medical History:  Diagnosis Date   Asthma     Past Surgical History:  Procedure Laterality Date   CESAREAN SECTION     CESAREAN SECTION MULTI-GESTATIONAL N/A 09/22/2021   Procedure: CESAREAN SECTION MULTI-GESTATIONAL;  Surgeon: Storm Setter, DO;  Location: [facility] LD ORS;  Service: Obstetrics;  Laterality: N/A;     Physical Exam HENT:     Head: Normocephalic.     Nose: Nose normal.  Eyes:     Pupils: Pupils are equal, round, and reactive to light.  Cardiovascular:     Rate and Rhythm: Normal rate.  Pulmonary:     Effort: Pulmonary effort is normal.  Abdominal:     General: Abdomen is flat.  Musculoskeletal:     Cervical back: Normal range of motion.  Neurological:     Mental Status: She is alert.     Cranial Nerves: Cranial nerves 2-12 are intact.     Sensory: Sensation is intact.     Motor: Motor function is intact.     Coordination: Coordination is intact.        Results for orders placed or performed during the hospital encounter of 07/18/23  MR BRAIN WO  CONTRAST   Narrative   CLINICAL DATA:  Initial evaluation for acute headache, neuro deficit.  EXAM: MRI HEAD WITHOUT CONTRAST  TECHNIQUE: Multiplanar, multiecho pulse sequences of the brain and surrounding structures were obtained  without intravenous contrast.  COMPARISON:  Or floppy fogging talking about the none available.  FINDINGS: Brain: Cerebral volume within normal limits. No significant cerebral white matter disease or other focal parenchymal signal abnormality. No evidence for acute or subacute infarct. Gray-white matter differentiation maintained. No areas of chronic cortical infarction. No acute or chronic intracranial blood products.  No mass lesion, midline shift or mass effect. No hydrocephalus or extra-axial fluid collection. Pituitary gland suprasellar region within normal limits.  Vascular: Major intracranial vascular flow voids are maintained.  Skull and upper cervical spine: Cerebellar tonsils are low lying extending up to 9 mm below the foramen magnum, consistent with Chiari 1 malformation. Mild to moderate crowding at the craniocervical junction. Decreased T1 signal intensity seen throughout the visualized bone marrow, nonspecific, but most commonly related to anemia, smoking, or obesity. No scalp soft tissue abnormality.  Sinuses/Orbits: Globes orbital soft tissues within normal limits. Moderate mucosal thickening present about the sphenoethmoidal and left maxillary sinus. Few scattered superimposed pneumatized secretions noted. No mastoid effusion.  Other: None.  IMPRESSION: 1. No acute intracranial abnormality. 2. 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 3. Moderate sphenoethmoidal and left maxillary sinusitis. Clinical correlation for possible acute sinusitis recommended.   Electronically Signed   By: Morene Hoard M.D.   On: 07/18/2023 18:25

## 2024-04-29 NOTE — Progress Notes (Signed)
 "   Referring Physician:  Rosslyn Dino HERO, MD [address] [place] [address] [place],  [place] [address]  Primary Physician:  Chrystal Lamarr RAMAN, MD  Discussed the use of AI scribe software for clinical note transcription with the patient, who gave verbal consent to proceed.  History of Present Illness Kana Reimann is a 38 year old female with cervical disc disease, myelomalacia, thoracic scoliosis, and syringomyelia who presents for evaluation of neck pain with left arm radiculopathy.  Neck pain began abruptly eight months ago while she was curling her hair, when her neck felt locked and she could not turn it. Pain is mainly in the neck with radiation into the left shoulder, down to the elbow and fingers, with intermittent tingling and altered sensation. Symptoms are bothersome but not severely disabling. She denies gait difficulty, dropping objects, loss of fine motor skills, and bowel or bladder dysfunction. She has brief occipital headaches lasting a few seconds. She has not tried physical therapy or injections for her neck, and prior chiropractic care was not targeted to the neck.  She has chronic bilateral aching pain in the posterior thighs for at least fifteen years, worsened by prolonged standing and cold. She describes no shooting or tingling quality. She recalls a few past episodes of difficulty walking related to lower back discomfort but no persistent gait instability. She has not had injections or physical therapy for her low back.  She was previously evaluated neurologically for possible Chiari malformation, with imaging described to her as borderline and with normal tonsillar shape. She has been told she has multiple cervical disc bulges or herniations and thoracic spinal curvature on prior imaging. She has not had spine surgery.  Conservative measures:  Physical therapy:  has not participated in  Multimodal medical therapy including regular antiinflammatories:   none Injections: no epidural steroid injections  Past Surgery: none  The symptoms are causing a significant impact on the patient's life.   I have utilized the care everywhere function in epic to review the outside records available from external health systems.  Review of Systems:  A 10 point review of systems is negative, except for the pertinent positives and negatives detailed in the HPI.  Past Medical History: Past Medical History:  Diagnosis Date   Asthma     Past Surgical History: Past Surgical History:  Procedure Laterality Date   CESAREAN SECTION     CESAREAN SECTION MULTI-GESTATIONAL N/A 09/22/2021   Procedure: CESAREAN SECTION MULTI-GESTATIONAL;  Surgeon: Storm Setter, DO;  Location: [facility] ORS;  Service: Obstetrics;  Laterality: N/A;    Allergies: Allergies as of 05/06/2024 - Review Complete 05/06/2024  Allergen Reaction Noted   Cocoa butter Hives 10/10/2021   Doxycycline Itching 07/14/2019   Percocet [oxycodone -acetaminophen ] Itching 12/25/2019   Vicks vapo steam [camphor-menthol]  06/08/2021   Pineapple Rash 12/25/2019    Medications: Current Medications[1]  Social History: Social History[2]  Family Medical History: Family History  Problem Relation Age of Onset   Cancer Mother    Hypertension Mother    Hyperlipidemia Mother    Allergic rhinitis Father    Cancer Father    Hyperlipidemia Father    Breast cancer Maternal Grandmother    Breast cancer Paternal Grandmother    Angioedema Neg Hx    Asthma Neg Hx    Urticaria Neg Hx    Immunodeficiency Neg Hx    Eczema Neg Hx     Physical Examination: Vitals:   05/06/24 1514  BP: 116/68  General: Patient is in no apparent distress. Attention to examination is appropriate.  Neck:   Supple.  Full range of motion.  Respiratory: Patient is breathing without any difficulty.   NEUROLOGICAL:     Awake, alert, oriented to person, place, and time.  Speech is clear and fluent.   Cranial  Nerves: Pupils equal round and reactive to light.  Facial tone is symmetric.  Facial sensation is symmetric. Shoulder shrug is symmetric. Tongue protrusion is midline.    Strength: Side Biceps Triceps Deltoid Interossei Grip Wrist Ext. Wrist Flex.  R 5 5 5 5 5 5 5   L 5 5 5 5 5 5 5    Side Iliopsoas Quads Hamstring PF DF EHL  R 5 5 5 5 5 5   L 5 5 5 5 5 5    Reflexes are 2+ and symmetric at the biceps, triceps, brachioradialis, patella and achilles.   Hoffman's is absent. Clonus is absent  Bilateral upper and lower extremity sensation is intact to light touch.     No evidence of dysmetria noted.  Gait is normal, somewhat forward pitched.   Imaging: Narrative & Impression  CLINICAL DATA:  Acute myelopathy, cervical spine. Left neck pain radiating down the left arm 2 days ago. No known injury.   EXAM: MRI CERVICAL SPINE WITHOUT CONTRAST   TECHNIQUE: Multiplanar, multisequence MR imaging of the cervical spine was performed. No intravenous contrast was administered.   COMPARISON:  MRI cervical spine 07/16/2023   FINDINGS: Alignment: There is again straightening of the normal cervical lordosis. Trace 1-2 mm retrolisthesis of C3 on C4, C4 on C5, and C5 on C6, unchanged. The atlantodens interval is intact.   Vertebrae: Vertebral body heights are maintained. Mild C4-5, mild-to-moderate C5-6, and mild-to-moderate posterior C6-7 disc space narrowing with decreased disc hydration, unchanged from prior. There is diffuse decreased T1 signal again seen throughout all the visualized bones. This is nonspecific but can be seen with red marrow reconversion in the settings of obesity, anemia, smoking, and other etiologies. No focal suspicious bone marrow lesion is seen. Minimal C4-5 edematous marrow endplate degenerative change.   Cord: There is again T2 hyperintense signal within the central spinal cord at the inferior C1 through the mid C3 levels, measuring up to approximately 2.7 cm in  craniocaudal dimension and 2-3 mm in transverse dimension, unchanged from prior. Normal caliber of the cervical cord.   Posterior Fossa, vertebral arteries, paraspinal tissues: No significant abnormality is seen within the paraspinal soft tissues. Normal vertebral artery flow voids. The visualized posterior fossa is unremarkable. Moderate peripheral partially visualized mucosal opacification within the left maxillary sinus.   Disc levels:   There again is decreased T1 decreased T2 signal at the posterior midline of the C4, C5, and C6 vertebral bodies that may reflect thickening and possible ossification of the posterior longitudinal ligament, unchanged.   C2-3: No posterior disc bulge, central canal narrowing, or neuroforaminal stenosis.   C3-4: Mild-to-moderate bilateral facet joint hypertrophy. Mild-to-moderate broad-based posterior disc bulge with superimposed left lateral recess posterior disc protrusion that moderately narrows the left lateral recess, not significantly changed. Mild bilateral neuroforaminal stenosis, unchanged. No central canal stenosis.   C4-5: Mild-to-moderate bilateral facet joint hypertrophy. Moderate left greater than right posterior disc bulge with mild mass effect on the left ventral cord. Mild-to-moderate central canal stenosis, unchanged from prior. Severe left lateral recess stenosis. Mild-to-moderate right and mild left neuroforaminal stenosis. No significant change from prior.   C5-6: Mild-to-moderate bilateral facet joint hypertrophy. Moderate broad-based posterior  disc osteophyte complex with midline posterior disc protrusion mildly impresses on the ventral cord, unchanged. Mild central canal stenosis, unchanged. No neuroforaminal stenosis.   C6-7: Mild-to-moderate bilateral facet joint hypertrophy. Moderate broad-based posterior disc osteophyte complex with mild mass effect on the midline ventral cord, unchanged. Mild central canal  stenosis, unchanged. No neuroforaminal stenosis.   C7-T1: No posterior disc bulge, central canal narrowing, or neuroforaminal stenosis. Mild right intraforaminal uncovertebral hypertrophy, unchanged.   IMPRESSION: Compared to 07/16/2023:   1. No significant change. 2. Redemonstration of T2 hyperintense signal within the central spinal cord at the inferior C1 through the mid C3 levels measuring up to 2.5 cm in craniocaudal length and 2-3 mm in width. Again this may represent a syrinx or developmentally prominent central canal. 3. Stable multilevel degenerative disc and joint changes as above. 4. C3-4 moderate left lateral recess stenosis and mild bilateral neuroforaminal stenosis. 5. C4-5 mild-to-moderate central canal stenosis, severe left lateral recess stenosis, and mild-to-moderate right and mild left neuroforaminal stenosis. 6. C5-6 and C6-7 mild central canal stenosis.     Electronically Signed   By: Tanda Lyons M.D.   On: 07/18/2023 18:32     I have personally reviewed the images and agree with the above interpretation.  Assessment and Plan Assessment & Plan Cervical degenerative disc disease with radiculopathy and myelomalacia Chronic cervical degenerative disc disease with multiple disc herniations, most severe at C3-4 and C4-5, associated with mild to moderate stenosis and possible t2 signal change. Symptoms are currently mild and intermittent, including neck pain radiating to the left arm, intermittent paresthesias, and mild headaches. No significant motor deficits, pathologic reflexes, or myelopathic signs. Current findings do not warrant surgical intervention. Her cervical spinal canal narrowed, increasing risk of spinal cord injury with trauma. Conservative management is preferred given mild symptoms; surgical decompression and disc replacement may be indicated if significant symptoms or myelopathy develop. Risks of surgery and rationale for conservative management  were discussed. - Ordered flexion-extension cervical spine radiographs to assess for instability. - Referred to physical therapy for cervical spine rehabilitation; provided referral options in discharge paperwork. - Provided anticipatory guidance regarding signs of myelopathy and instructed her to report new symptoms. - Discussed conditional plan for cervical epidural steroid injections if radicular pain worsens. - Discussed surgical decompression and disc replacement as options if she develops significant symptoms or myelopathy, but deferred at this time. - Scheduled follow-up in six months for re-evaluation of symptoms and neurological examination. - Radiology obtained ordered x-rays during visit.  Syringomyelia Borderline syrinx vs dilated central canal identified on imaging without clinical or neurological findings suggestive of syringomyelia. No evidence of progressive sensory changes or weakness attributable to the syrinx. Observation is appropriate unless new symptoms develop. - Instructed to monitor for new symptoms suggestive of syringomyelia, such as progressive sensory changes or weakness. - No intervention indicated at this time.  Thoracic scoliosis Mild thoracic scoliosis, likely contributing to chronic bilateral posterior thigh pain, especially with prolonged standing or cold exposure. No evidence of acute neurological compromise. Conservative management is appropriate. - Recommended physical therapy to address postural compensation and muscle imbalance. - Instructed to monitor for progression of symptoms or development of neurological deficits.  Penne MICAEL Sharps MD/MSCR Neurosurgery      [1]  Current Outpatient Medications:    albuterol  (VENTOLIN  HFA) 108 (90 Base) MCG/ACT inhaler, Inhale 2 puffs into the lungs every 6 (six) hours as needed for wheezing or shortness of breath., Disp: 1 each, Rfl: 2   cetirizine  (ZYRTEC )  10 MG tablet, Take 1 tablet (10 mg total) by mouth  daily., Disp: 32 tablet, Rfl: 5   EPINEPHrine  0.3 mg/0.3 mL IJ SOAJ injection, Inject 0.3 mg into the muscle as needed for anaphylaxis., Disp: 1 each, Rfl: 0   etonogestrel (NEXPLANON) 68 MG IMPL implant, daily., Disp: , Rfl:    Fluticasone-Umeclidin-Vilant (TRELEGY ELLIPTA ) 200-62.5-25 MCG/ACT AEPB, Inhale 1 puff into the lungs daily., Disp: 60 each, Rfl: 5   ipratropium-albuterol  (DUONEB) 0.5-2.5 (3) MG/3ML SOLN, , Disp: , Rfl:    montelukast  (SINGULAIR ) 10 MG tablet, Take 1 tablet (10 mg total) by mouth as needed., Disp: 32 tablet, Rfl: 5 [2]  Social History Tobacco Use   Smoking status: Never   Smokeless tobacco: Never  Vaping Use   Vaping status: Never Used  Substance Use Topics   Alcohol use: Not Currently    Comment: not since pregnant   Drug use: Never   "

## 2024-05-06 ENCOUNTER — Encounter: Payer: Self-pay | Admitting: Neurosurgery

## 2024-05-06 ENCOUNTER — Ambulatory Visit

## 2024-05-06 ENCOUNTER — Ambulatory Visit: Admitting: Neurosurgery

## 2024-05-06 VITALS — BP 116/68 | Ht 61.0 in | Wt 159.0 lb

## 2024-05-06 DIAGNOSIS — G950 Syringomyelia and syringobulbia: Secondary | ICD-10-CM | POA: Insufficient documentation

## 2024-05-06 DIAGNOSIS — M4184 Other forms of scoliosis, thoracic region: Secondary | ICD-10-CM | POA: Diagnosis not present

## 2024-05-06 DIAGNOSIS — M5031 Other cervical disc degeneration,  high cervical region: Secondary | ICD-10-CM

## 2024-05-06 DIAGNOSIS — M542 Cervicalgia: Secondary | ICD-10-CM | POA: Diagnosis not present

## 2024-05-06 DIAGNOSIS — M5030 Other cervical disc degeneration, unspecified cervical region: Secondary | ICD-10-CM

## 2024-05-06 DIAGNOSIS — M50321 Other cervical disc degeneration at C4-C5 level: Secondary | ICD-10-CM

## 2024-05-06 NOTE — Patient Instructions (Signed)

## 2024-05-16 ENCOUNTER — Ambulatory Visit: Payer: Self-pay | Admitting: Neurosurgery

## 2024-07-05 ENCOUNTER — Ambulatory Visit: Admitting: Neurosurgery
